# Patient Record
Sex: Male | Born: 2005 | Race: White | Hispanic: No | Marital: Single
Health system: Southern US, Community
[De-identification: ages and names within clinical notes are randomized; demographics above are authoritative.]

## PROBLEM LIST (undated history)

## (undated) DIAGNOSIS — R569 Unspecified convulsions: Secondary | ICD-10-CM

## (undated) HISTORY — DX: Unspecified convulsions: R56.9

---
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
## 2023-03-25 DIAGNOSIS — G40109 Localization-related (focal) (partial) symptomatic epilepsy and epileptic syndromes with simple partial seizures, not intractable, without status epilepticus: Secondary | ICD-10-CM | POA: Diagnosis not present

## 2023-06-24 DIAGNOSIS — G40109 Localization-related (focal) (partial) symptomatic epilepsy and epileptic syndromes with simple partial seizures, not intractable, without status epilepticus: Secondary | ICD-10-CM | POA: Diagnosis not present

## 2023-07-02 DIAGNOSIS — R569 Unspecified convulsions: Secondary | ICD-10-CM | POA: Diagnosis not present

## 2023-07-18 DIAGNOSIS — G40109 Localization-related (focal) (partial) symptomatic epilepsy and epileptic syndromes with simple partial seizures, not intractable, without status epilepticus: Secondary | ICD-10-CM | POA: Diagnosis not present

## 2023-08-09 ENCOUNTER — Encounter (HOSPITAL_COMMUNITY): Payer: Self-pay

## 2023-08-09 ENCOUNTER — Inpatient Hospital Stay (HOSPITAL_COMMUNITY)
Admission: EM | Admit: 2023-08-09 | Discharge: 2023-08-11 | DRG: 101 | Disposition: A | Discharge status: Home / Self Care | Attending: Family Medicine | Admitting: Family Medicine

## 2023-08-09 ENCOUNTER — Emergency Department (HOSPITAL_COMMUNITY)

## 2023-08-09 ENCOUNTER — Other Ambulatory Visit: Payer: Self-pay

## 2023-08-09 ENCOUNTER — Inpatient Hospital Stay (HOSPITAL_COMMUNITY)

## 2023-08-09 DIAGNOSIS — W06XXXA Fall from bed, initial encounter: Secondary | ICD-10-CM | POA: Diagnosis present

## 2023-08-09 DIAGNOSIS — Z781 Physical restraint status: Secondary | ICD-10-CM

## 2023-08-09 DIAGNOSIS — Z7722 Contact with and (suspected) exposure to environmental tobacco smoke (acute) (chronic): Secondary | ICD-10-CM | POA: Diagnosis present

## 2023-08-09 DIAGNOSIS — S0083XA Contusion of other part of head, initial encounter: Secondary | ICD-10-CM | POA: Diagnosis present

## 2023-08-09 DIAGNOSIS — R451 Restlessness and agitation: Secondary | ICD-10-CM

## 2023-08-09 DIAGNOSIS — S199XXA Unspecified injury of neck, initial encounter: Secondary | ICD-10-CM | POA: Diagnosis not present

## 2023-08-09 DIAGNOSIS — G40409 Other generalized epilepsy and epileptic syndromes, not intractable, without status epilepticus: Principal | ICD-10-CM | POA: Diagnosis present

## 2023-08-09 DIAGNOSIS — R0689 Other abnormalities of breathing: Secondary | ICD-10-CM | POA: Diagnosis not present

## 2023-08-09 DIAGNOSIS — R569 Unspecified convulsions: Secondary | ICD-10-CM | POA: Diagnosis not present

## 2023-08-09 DIAGNOSIS — R400 Somnolence: Secondary | ICD-10-CM | POA: Diagnosis not present

## 2023-08-09 DIAGNOSIS — G4700 Insomnia, unspecified: Secondary | ICD-10-CM | POA: Diagnosis not present

## 2023-08-09 DIAGNOSIS — Z79899 Other long term (current) drug therapy: Secondary | ICD-10-CM | POA: Diagnosis not present

## 2023-08-09 DIAGNOSIS — E876 Hypokalemia: Secondary | ICD-10-CM | POA: Diagnosis present

## 2023-08-09 DIAGNOSIS — Y92003 Bedroom of unspecified non-institutional (private) residence as the place of occurrence of the external cause: Secondary | ICD-10-CM | POA: Diagnosis not present

## 2023-08-09 DIAGNOSIS — H5500 Unspecified nystagmus: Secondary | ICD-10-CM | POA: Diagnosis not present

## 2023-08-09 DIAGNOSIS — R6889 Other general symptoms and signs: Secondary | ICD-10-CM | POA: Diagnosis not present

## 2023-08-09 DIAGNOSIS — R9431 Abnormal electrocardiogram [ECG] [EKG]: Secondary | ICD-10-CM | POA: Diagnosis not present

## 2023-08-09 DIAGNOSIS — R000 Tachycardia, unspecified: Secondary | ICD-10-CM | POA: Diagnosis not present

## 2023-08-09 DIAGNOSIS — S0990XA Unspecified injury of head, initial encounter: Secondary | ICD-10-CM | POA: Diagnosis not present

## 2023-08-09 DIAGNOSIS — R32 Unspecified urinary incontinence: Secondary | ICD-10-CM | POA: Diagnosis not present

## 2023-08-09 DIAGNOSIS — Z743 Need for continuous supervision: Secondary | ICD-10-CM | POA: Diagnosis not present

## 2023-08-09 LAB — CBC WITH DIFFERENTIAL/PLATELET
Abs Immature Granulocytes: 0.04 10*3/uL (ref 0.00–0.07)
Basophils Absolute: 0.1 10*3/uL (ref 0.0–0.1)
Basophils Relative: 0 %
Eosinophils Absolute: 0 10*3/uL (ref 0.0–0.5)
Eosinophils Relative: 0 %
HCT: 40.4 % (ref 39.0–52.0)
Hemoglobin: 13.8 g/dL (ref 13.0–17.0)
Immature Granulocytes: 0 %
Lymphocytes Relative: 9 %
Lymphs Abs: 1.1 10*3/uL (ref 0.7–4.0)
MCH: 30.7 pg (ref 26.0–34.0)
MCHC: 34.2 g/dL (ref 30.0–36.0)
MCV: 90 fL (ref 80.0–100.0)
Monocytes Absolute: 0.7 10*3/uL (ref 0.1–1.0)
Monocytes Relative: 6 %
Neutro Abs: 9.4 10*3/uL — ABNORMAL HIGH (ref 1.7–7.7)
Neutrophils Relative %: 85 %
Platelets: 231 10*3/uL (ref 150–400)
RBC: 4.49 MIL/uL (ref 4.22–5.81)
RDW: 12.5 % (ref 11.5–15.5)
WBC: 11.3 10*3/uL — ABNORMAL HIGH (ref 4.0–10.5)
nRBC: 0 % (ref 0.0–0.2)

## 2023-08-09 LAB — COMPREHENSIVE METABOLIC PANEL WITH GFR
ALT: 21 U/L (ref 0–44)
AST: 22 U/L (ref 15–41)
Albumin: 4.4 g/dL (ref 3.5–5.0)
Alkaline Phosphatase: 61 U/L (ref 38–126)
Anion gap: 10 (ref 5–15)
BUN: 12 mg/dL (ref 6–20)
CO2: 22 mmol/L (ref 22–32)
Calcium: 9.1 mg/dL (ref 8.9–10.3)
Chloride: 101 mmol/L (ref 98–111)
Creatinine, Ser: 0.8 mg/dL (ref 0.61–1.24)
GFR, Estimated: >60 mL/min (ref >60)
Glucose, Bld: 88 mg/dL (ref 70–99)
Potassium: 4 mmol/L (ref 3.5–5.1)
Sodium: 133 mmol/L — ABNORMAL LOW (ref 135–145)
Total Bilirubin: 0.5 mg/dL (ref 0.0–1.2)
Total Protein: 7 g/dL (ref 6.5–8.1)

## 2023-08-09 LAB — CBG MONITORING, ED: Glucose-Capillary: 104 mg/dL — ABNORMAL HIGH (ref 70–99)

## 2023-08-09 LAB — GLUCOSE, CAPILLARY: Glucose-Capillary: 94 mg/dL (ref 70–99)

## 2023-08-09 MED ORDER — LEVETIRACETAM IN NACL 1000 MG/100ML IV SOLN: 1000.0000 mg | Freq: Once | INTRAVENOUS | Status: AC

## 2023-08-09 MED ORDER — LEVETIRACETAM IN NACL 1000 MG/100ML IV SOLN
INTRAVENOUS | Status: AC
  Administered 2023-08-09: 1000 mg via INTRAVENOUS
  Filled 2023-08-09: qty 400

## 2023-08-09 MED ORDER — LORAZEPAM 2 MG/ML IJ SOLN
1.0000 mg | INTRAMUSCULAR | Status: DC | PRN
  Filled 2023-08-09: qty 1

## 2023-08-09 MED ORDER — LORAZEPAM 2 MG/ML IJ SOLN
1.0000 mg | INTRAMUSCULAR | Status: DC
Start: 2023-08-09 — End: 2023-08-09

## 2023-08-09 MED ORDER — SODIUM CHLORIDE 0.9 % IV SOLN: 60.0000 mg/kg | Freq: Once | INTRAVENOUS | Status: DC

## 2023-08-09 MED ORDER — SODIUM CHLORIDE 0.9 % IV SOLN
200.0000 mg | Freq: Once | INTRAVENOUS | Status: AC
  Administered 2023-08-09: 200 mg via INTRAVENOUS
  Filled 2023-08-09: qty 20

## 2023-08-09 MED ORDER — LEVETIRACETAM IN NACL 1500 MG/100ML IV SOLN
1500.0000 mg | Freq: Once | INTRAVENOUS | Status: AC
  Administered 2023-08-09: 1500 mg via INTRAVENOUS

## 2023-08-09 MED ORDER — LORAZEPAM 2 MG/ML IJ SOLN
INTRAMUSCULAR | Status: AC
  Administered 2023-08-09: 2 mg
  Filled 2023-08-09: qty 1

## 2023-08-09 MED ORDER — LORAZEPAM 2 MG/ML IJ SOLN
INTRAMUSCULAR | Status: AC
  Filled 2023-08-09: qty 1

## 2023-08-09 MED ORDER — OXCARBAZEPINE 300 MG/5ML PO SUSP
600.0000 mg | Freq: Every morning | ORAL | Status: DC
  Filled 2023-08-09: qty 10

## 2023-08-09 MED ORDER — HEPARIN SODIUM (PORCINE) 5000 UNIT/ML IJ SOLN
5000.0000 [IU] | Freq: Three times a day (TID) | INTRAMUSCULAR | Status: DC
  Administered 2023-08-10 – 2023-08-11 (×3): 5000 [IU] via SUBCUTANEOUS
  Filled 2023-08-09 (×3): qty 1

## 2023-08-09 MED ORDER — SODIUM CHLORIDE 0.9 % IV SOLN
100.0000 mg | Freq: Two times a day (BID) | INTRAVENOUS | Status: DC
  Administered 2023-08-10: 100 mg via INTRAVENOUS
  Filled 2023-08-09 (×2): qty 10

## 2023-08-09 MED ORDER — LORAZEPAM 2 MG/ML IJ SOLN
1.0000 mg | INTRAMUSCULAR | Status: AC | PRN
  Administered 2023-08-09 (×2): 1 mg via INTRAVENOUS
  Filled 2023-08-09: qty 1

## 2023-08-09 MED ORDER — LACTATED RINGERS IV SOLN: INTRAVENOUS | Status: AC

## 2023-08-09 MED ORDER — OXCARBAZEPINE 300 MG/5ML PO SUSP
900.0000 mg | Freq: Every evening | ORAL | Status: DC
  Filled 2023-08-09: qty 15

## 2023-08-09 NOTE — Progress Notes (Signed)
 Admission Notes:  2100H -  Received patient from [facility] via stretcher accompanied by [facility]. Drowsy but responds to painful stimuli. Oriented to himself, able to tell his name. Able to follow simple commands then back to sleep again. Patient NPO.  [id number]: Seen and examined by Dr. Tollie Eth, MD. Ordered Overnight EEG. Approved to put patient on condom cath for the meantime since patient is on bed rest as ordered. 2230: For EEG placement but patient is agitated and uncooperative. Pulling leads and tried to get out of bed. Informed Dr. Loleta Books, MD. Ordered Ativan IV, given as ordered. Still patient tried to get out of bed. 2234: Ordered wrist and belt restraint for the patient. 2253: Called father Mr. Francella Solian, explained and informed regarding wrist and belt restraint.

## 2023-08-09 NOTE — H&P (Addendum)
 [facility] H&P   Patient Demographics:    Darius Allen, is a 18 y.o. male  MRN: [id number]   DOB - 15-Dec-2005  Admit Date - 08/09/2023  Outpatient Primary MD for the patient is Jeral Pinch, Dorina Hoyer, PA-C  Referring MD/NP/PA: Dr. Estell Harpin  Outpatient Specialists: [facility] neurology pediatric  Patient coming from: Home  Chief Complaint  Patient presents with   Seizures      HPI:    Darius Allen  is a 18 y.o. male, with known past medical history of seizures, is following with [facility] pediatric neurology, he had frequent seizure medications adjustments due to side effect of seizure meds or poor response, most recently he has been on Trileptal, but when he still used to have seizures he went on Trileptal taper with Aptiom initiation, per family they report he is usually as well having insomnia and poor night sleep. -History provided by family at bedside, patient presents to ED secondary to multiple seizures overnight, they report over 12 seizures, one of them he had a fall from the bed and hurt his head with some significant bruising, and ED patient has been had another 2 seizures where he required Ativan and he was loaded with Keppra, he had urine incontinence during ED seizure event as well, CT head with no acute findings, significant for mild hypokalemia at 133, mild leukocytosis at 11.3, ED physician discussed with neurology on-call who recommended loading with Keppra and transferring to [facility] hospital[facility] consulted to admit.    Review of systems:    Patient unable to provide any history given his postictal and he received Ativan as well  With Past History of the following :    Past Medical History:  Diagnosis Date   Seizures (HCC)    no seizure since starting trileptal 12/2019      Past Surgical History:  Procedure Laterality Date   ORIF RADIAL FRACTURE Right 08/16/2020    Procedure: OPEN REDUCTION INTERNAL FIXATION (ORIF) RADIAL AND ULNAR FRACTURE;  Surgeon: Sheral Apley, MD;  Location: [facility];  Service: Orthopedics;  Laterality: Right;   ORIF ULNAR FRACTURE Right 08/16/2020   Procedure: OPEN REDUCTION INTERNAL FIXATION (ORIF) ULNAR FRACTURE;  Surgeon: Sheral Apley, MD;  Location: [facility];  Service: Orthopedics;  Laterality: Right;      Social History:     Social History   Tobacco Use   Smoking status: Passive Smoke Exposure - Never Smoker   Smokeless tobacco: Never   Tobacco comments:    outside smoking  Substance Use Topics   Alcohol use: Not Currently    Alcohol/week: 0.0 standard drinks of alcohol       Family History :    History reviewed. No pertinent family history.    Home Medications:   Prior to Admission medications   Medication Sig Start Date End Date Taking? Authorizing Provider  Eslicarbazepine Acetate (APTIOM) 800 MG TABS Take 2 tablets by mouth at bedtime.   Yes [provider]  Midazolam 5 MG/0.1ML SOLN Place 5 mg into the nose as needed (seizures lasting >5 minutes long OR 3+ seizures in 1 hour without return to baseline). 06/06/23  Yes [provider]  oxcarbazepine (TRILEPTAL) 600 MG tablet Take 600 mg by mouth See admin instructions. Take 1 tablet in the morning and 1 and 1/2 in the evening 07/12/20  Yes [provider]     Allergies:    No Known Allergies   Physical Exam:   Vitals  Blood pressure 117/63, pulse 76, temperature 98 F (36.7 C), temperature source Axillary, resp. rate (!) 23, height 6' (1.829 m), weight 68 kg, SpO2 97%.   1. General Somnolent as he just received Ativan,  2.  Somnolent, wakes up but unable to answer any question or follow commands go back to sleep right away  3.  No gross deficits noted patient with some forehead bruises from fall secondary to seizures  4. Ears and Eyes appear Normal, Conjunctivae clear,  postdilated after receiving Ativan.  Moist Oral Mucosa.  5. Supple Neck, No JVD, No cervical lymphadenopathy appriciated, No Carotid Bruits.  6. Symmetrical Chest wall movement, Good air movement bilaterally, CTAB.  7. RRR, No Gallops, Rubs or Murmurs, No Parasternal Heave.  8. Positive Bowel Sounds, Abdomen Soft, No tenderness, No organomegaly appriciated,No rebound -guarding or rigidity.  9.  No Cyanosis, Normal Skin Turgor, No Skin Rash or Bruise.  10. Good muscle tone,  joints appear normal , no effusions       Data Review:    CBC Recent Labs  Lab 08/09/23 1550  WBC 11.3*  HGB 13.8  HCT 40.4  PLT 231  MCV 90.0  MCH 30.7  MCHC 34.2  RDW 12.5  LYMPHSABS 1.1  MONOABS 0.7  EOSABS 0.0  BASOSABS 0.1   ------------------------------------------------------------------------------------------------------------------  Chemistries  Recent Labs  Lab 08/09/23 1550  NA 133*  K 4.0  CL 101  CO2 22  GLUCOSE 88  BUN 12  CREATININE 0.80  CALCIUM 9.1  AST 22  ALT 21  ALKPHOS 61  BILITOT 0.5   ------------------------------------------------------------------------------------------------------------------ estimated creatinine clearance is 142.8 mL/min (by C-G formula based on SCr of 0.8 mg/dL). ------------------------------------------------------------------------------------------------------------------ No results for input(s): "TSH", "T4TOTAL", "T3FREE", "THYROIDAB" in the last 72 hours.  Invalid input(s): "FREET3"  Coagulation profile No results for input(s): "INR", "PROTIME" in the last 168 hours. ------------------------------------------------------------------------------------------------------------------- No results for input(s): "DDIMER" in the last 72 hours. -------------------------------------------------------------------------------------------------------------------  Cardiac Enzymes No results for input(s): "CKMB", "TROPONINI", "MYOGLOBIN"  in the last 168 hours.  Invalid input(s): "CK" ------------------------------------------------------------------------------------------------------------------ No results found for: "BNP"   ---------------------------------------------------------------------------------------------------------------  Urinalysis No results found for: "COLORURINE", "APPEARANCEUR", "LABSPEC", "PHURINE", "GLUCOSEU", "HGBUR", "BILIRUBINUR", "KETONESUR", "PROTEINUR", "UROBILINOGEN", "NITRITE", "LEUKOCYTESUR"  ----------------------------------------------------------------------------------------------------------------   Imaging Results:    CT Cervical Spine Wo Contrast Result Date: 08/09/2023 CLINICAL DATA:  Neck trauma, focal neuro deficit or paresthesia (Age 49-64y) EXAM: CT CERVICAL SPINE WITHOUT CONTRAST TECHNIQUE: Multidetector CT imaging of the cervical spine was performed without intravenous contrast. Multiplanar CT image reconstructions were also generated. RADIATION DOSE REDUCTION: This exam was performed according to the departmental dose-optimization program which includes automated exposure control, adjustment of the mA and/or kV according to patient size and/or use of iterative reconstruction technique. COMPARISON:  None Available. FINDINGS: Alignment: Straightening and mild broad-based reversal of normal lordosis. No traumatic subluxation. Skull base and vertebrae: No acute fracture. Vertebral body heights  are maintained. The dens and skull base are intact. None fusion posterior arch of C1 is a normal variant. Soft tissues and spinal canal: No prevertebral fluid or swelling. No visible canal hematoma. Disc levels:  Preserved. Upper chest: Negative. Other: None. IMPRESSION: 1. No acute fracture or subluxation of the cervical spine. 2. Straightening and mild broad-based reversal of normal lordosis may be positional or due to muscle spasm. Electronically Signed   By: Narda Rutherford M.D.   On: 08/09/2023  17:34   CT Head Wo Contrast Result Date: 08/09/2023 CLINICAL DATA:  Head trauma, abnormal mental status (Age 85-64y) EXAM: CT HEAD WITHOUT CONTRAST TECHNIQUE: Contiguous axial images were obtained from the base of the skull through the vertex without intravenous contrast. RADIATION DOSE REDUCTION: This exam was performed according to the departmental dose-optimization program which includes automated exposure control, adjustment of the mA and/or kV according to patient size and/or use of iterative reconstruction technique. COMPARISON:  None Available. FINDINGS: Brain: No intracranial hemorrhage, mass effect, or midline shift. No hydrocephalus. The basilar cisterns are patent. No evidence of territorial infarct or acute ischemia. No extra-axial or intracranial fluid collection. Vascular: No hyperdense vessel or unexpected calcification. Skull: Normal. Negative for fracture or focal lesion. Sinuses/Orbits: Scattered mucosal thickening in the ethmoid air cells. No sinus fluid levels. Unremarkable appearance of the orbits. Other: None. IMPRESSION: No acute intracranial abnormality. No skull fracture. Electronically Signed   By: Narda Rutherford M.D.   On: 08/09/2023 17:31     EKG:  Vent. rate 88 BPM PR interval 170 ms QRS duration 92 ms QT/QTcB 346/419 ms P-R-T axes 75 77 72 Sinus rhythm ST elev, probable normal early repol pattern  Assessment & Plan:    Principal Problem:   Seizure (HCC)    Seizures -With known extensive history of seizures, followed at [facility] pediatric neurology clinic, with frequent medication adjustments, most recently he had been on  Trileptal 2-week wean and initiation of Aptium, unclear if this weaning is too rapid for him detriment of his seizures, but there family at bedside report this is states has ever been, they report patient is compliant with medications. -Patient was loaded with Keppra per initial ED discussion with neurorecommendation, he is transferred to [facility] for further evaluation and further recommendation per neurology regarding AEDs and possible LTM EEG. -Continue with as needed Ativan for seizures -Continue with seizure precautions   DVT Prophylaxis Heparin  AM Labs Ordered, also please review Full Orders  Family Communication: Admission, patients condition and plan of care including tests being ordered have been discussed with the patient's brother and father at bedside who indicate understanding and agree with the plan and Code Status.  Code Status full code  Likely DC to home  Consults called: Neurology  Admission status: Inpatient  Time spent in minutes : 55 minutes   Huey Bienenstock M.D on 08/09/2023 at 8:23 PM   [facility] - Office  [phone]

## 2023-08-09 NOTE — ED Triage Notes (Signed)
 Pt BIB ems for multiples seizure episodes. Pt was just started on Aptiom. Per EMS pt family stated postictal, EMS states pt jumped at them.

## 2023-08-09 NOTE — Progress Notes (Signed)
 LTM EEG hooked up and running - no initial skin breakdown - push button tested - [facility] monitoring.

## 2023-08-09 NOTE — ED Notes (Signed)
 Report called

## 2023-08-09 NOTE — ED Notes (Signed)
 Called to room for patient actively seizing.  Dr. Estell Harpin at bedside.

## 2023-08-09 NOTE — Consult Note (Signed)
 NEUROLOGY CONSULT NOTE   Date of service: August 09, 2023 Patient Name: Darius Allen MRN:  [id number] DOB:  February 20, 2006 Chief Complaint: "clustering of seizures" Requesting Provider: Starleen Arms, MD  History of Present Illness  Darius Allen is a 18 y.o. male with hx of epilepsy and follows with [facility] pediatric neurology and recently switched from Trileptal to Aptiom due to multiple breakthrough seizures, who was brought in to the ED at [facility] after having 12 seizures in a day.  Father reports that patient has been sleeping much less over the last couple of days. He just finished a cross taper of trileptal and weaened off Trileptal over the course of 3 weeks and uptitrated on Aptiom over the same period. Over the last 2 days, he has only taken Aptiom. He is on 1600mg  at bedtime. He had multiple seizures yesterday and had about 12 seizures today so they decided to call EMS.  No fever, no recent URI like symptoms.  Patient had a CT HEAd at [facility] which was negative for any acute intracranial abnormalities.  He was transferred to [facility] for further evaluation and workup.    ROS  Unable to ascertain due to somnolence.  Past History   Past Medical History:  Diagnosis Date   Seizures (HCC)    no seizure since starting trileptal 12/2019    Past Surgical History:  Procedure Laterality Date   ORIF RADIAL FRACTURE Right 08/16/2020   Procedure: OPEN REDUCTION INTERNAL FIXATION (ORIF) RADIAL AND ULNAR FRACTURE;  Surgeon: Sheral Apley, MD;  Location: [facility];  Service: Orthopedics;  Laterality: Right;   ORIF ULNAR FRACTURE Right 08/16/2020   Procedure: OPEN REDUCTION INTERNAL FIXATION (ORIF) ULNAR FRACTURE;  Surgeon: Sheral Apley, MD;  Location: [facility];  Service: Orthopedics;  Laterality: Right;    Family History: History reviewed. No pertinent family history.  Social History  reports that he is a non-smoker but has  been exposed to tobacco smoke. He has never used smokeless tobacco. He reports that he does not currently use alcohol. He reports that he does not currently use drugs.  No Known Allergies  Medications   Current Facility-Administered Medications:    [START ON 08/10/2023] heparin injection 5,000 Units, 5,000 Units, Subcutaneous, Q8H, Elgergawy, Leana Roe, MD   [START ON 08/10/2023] lacosamide (VIMPAT) 100 mg in sodium chloride 0.9 % 25 mL IVPB, 100 mg, Intravenous, BID, Erick Blinks, MD   lacosamide (VIMPAT) 200 mg in sodium chloride 0.9 % 25 mL IVPB, 200 mg, Intravenous, Once, Erick Blinks, MD   lactated ringers infusion, , Intravenous, Continuous, Elgergawy, Leana Roe, MD, Last Rate: 75 mL/hr at 08/09/23 2125, New Bag at 08/09/23 2125   LORazepam (ATIVAN) injection 1 mg, 1 mg, Intravenous, Q4H PRN, Elgergawy, Leana Roe, MD  Vitals   Vitals:   08/09/23 1745 08/09/23 1930 08/09/23 1943 08/09/23 2109  BP:  117/63  (!) 110/58  Pulse: 76   77  Resp: 20 (!) 23  16  Temp:   98 F (36.7 C) 98.7 F (37.1 C)  TempSrc:   Axillary Oral  SpO2: 97%   97%  Weight:      Height:        Body mass index is 20.33 kg/m.  Physical Exam   General: Laying comfortably in bed; in no acute distress.  HENT: Normal oropharynx and mucosa. Normal external appearance of ears and nose.  Neck: Supple, no pain or tenderness  CV: No  JVD. No peripheral edema.  Pulmonary: Symmetric Chest rise. Normal respiratory effort.  Abdomen: Soft to touch, non-tender.  Ext: No cyanosis, edema, or deformity  Skin: No rash. Normal palpation of skin.   Musculoskeletal: Normal digits and nails by inspection. No clubbing.   Neurologic Examination  Mental status/Cognition: eyes closed, does not respond to voice or loud clap. Agitated to tactile stimulation and opens eyes to vigorous tactile stimulation and oriented to self. Speech/language: oriented to self, follows commands in all 4 extremities Cranial nerves:   CN  II Pupils equal and reactive to light, makes brief eye contact   CN III,IV,VI EOM intact, no gaze preference or deviation, no nystagmus   CN V normal sensation in V1, V2, and V3 segments bilaterally    CN VII no asymmetry, no nasolabial fold flattening    CN VIII normal hearing to speech    CN IX & X normal palatal elevation, no uvular deviation    CN XI 5/5 head turn and 5/5 shoulder shrug bilaterally    CN XII midline tongue protrusion    Motor:  Muscle bulk: normal, tone normal Squeezes with BL hands on commands. Wiggles toes on command.  Sensation:  Light touch Intact in all extremities.   Pin prick    Temperature    Vibration   Proprioception    Coordination/Complex Motor:  Unabel to assess 2/2 somnolence.  Labs/Imaging/Neurodiagnostic studies   CBC:  Recent Labs  Lab 2023-08-31 1550  WBC 11.3*  NEUTROABS 9.4*  HGB 13.8  HCT 40.4  MCV 90.0  PLT 231   Basic Metabolic Panel:  Lab Results  Component Value Date   NA 133 (L) 08/31/23   K 4.0 08-31-23   CO2 22 08/31/23   GLUCOSE 88 2023/08/31   BUN 12 08-31-23   CREATININE 0.80 08/31/23   CALCIUM 9.1 08-31-2023   GFRNONAA >60 08-31-2023   Lipid Panel: No results found for: "LDLCALC" HgbA1c: No results found for: "HGBA1C" Urine Drug Screen: No results found for: "LABOPIA", "COCAINSCRNUR", "LABBENZ", "AMPHETMU", "THCU", "LABBARB"  Alcohol Level No results found for: "ETH" INR No results found for: "INR" APTT No results found for: "APTT" AED levels: No results found for: "PHENYTOIN", "ZONISAMIDE", "LAMOTRIGINE", "LEVETIRACETA"  CT Head without contrast(Personally reviewed): CTH was negative for a large hypodensity concerning for a large territory infarct or hyperdensity concerning for an ICH  Neurodiagnostics cEEG:  pending  ASSESSMENT   Darius Allen is a 18 y.o. male with hx of epilepsy and follows with [facility] pediatric neurology and recently switched from Trileptal to Aptiom due to multiple  breakthrough seizures, who was brought in to the ED at [facility] after having 12 seizures in a day.  Has been sleep deprived recently. Family worried about Aptiom not being effective. No fever, no recent URI like symptoms.  He is too somnolent to tolerate PO and too agitated to attempt NG tube overnight.  Family requesting switching back to Trileptal.  RECOMMENDATIONS  - Vimpat 200mg  IV once, followed by 100mg  BID overnight. Failed Keppra in the past. - consider starting back on trileptal in AM when he is more awake. Will likely need gradual uptitration. Starting at 300mg  BID and gradually uptitrating to 600mg  in AM and 900mg  in PM which was his previous dose. Can increase dose by 600mg  every week. Will likely need Vimpat as a bridge while uptitrating Trileptal. - cEEG overnight - Ativan for seizure lasting more than 3 mins - seizure precautions. - will stop Aptiom at family's request.  ______________________________________________________________________  Plan discussed with patient's father over phone. Also discussed needs for soft wrist restraints due to agitation.   Signed, Erick Blinks, MD [facility]

## 2023-08-10 ENCOUNTER — Inpatient Hospital Stay (HOSPITAL_COMMUNITY)

## 2023-08-10 DIAGNOSIS — R569 Unspecified convulsions: Secondary | ICD-10-CM | POA: Diagnosis not present

## 2023-08-10 LAB — BASIC METABOLIC PANEL WITH GFR
Anion gap: 9 (ref 5–15)
BUN: 7 mg/dL (ref 6–20)
CO2: 24 mmol/L (ref 22–32)
Calcium: 9.2 mg/dL (ref 8.9–10.3)
Chloride: 104 mmol/L (ref 98–111)
Creatinine, Ser: 0.77 mg/dL (ref 0.61–1.24)
GFR, Estimated: >60 mL/min (ref >60)
Glucose, Bld: 79 mg/dL (ref 70–99)
Potassium: 4 mmol/L (ref 3.5–5.1)
Sodium: 137 mmol/L (ref 135–145)

## 2023-08-10 LAB — CBC
HCT: 37.6 % — ABNORMAL LOW (ref 39.0–52.0)
Hemoglobin: 12.7 g/dL — ABNORMAL LOW (ref 13.0–17.0)
MCH: 30.4 pg (ref 26.0–34.0)
MCHC: 33.8 g/dL (ref 30.0–36.0)
MCV: 90 fL (ref 80.0–100.0)
Platelets: 200 10*3/uL (ref 150–400)
RBC: 4.18 MIL/uL — ABNORMAL LOW (ref 4.22–5.81)
RDW: 12.8 % (ref 11.5–15.5)
WBC: 10.5 10*3/uL (ref 4.0–10.5)
nRBC: 0 % (ref 0.0–0.2)

## 2023-08-10 LAB — GLUCOSE, CAPILLARY
Glucose-Capillary: 168 mg/dL — ABNORMAL HIGH (ref 70–99)
Glucose-Capillary: 194 mg/dL — ABNORMAL HIGH (ref 70–99)
Glucose-Capillary: 53 mg/dL — ABNORMAL LOW (ref 70–99)
Glucose-Capillary: 54 mg/dL — ABNORMAL LOW (ref 70–99)
Glucose-Capillary: 59 mg/dL — ABNORMAL LOW (ref 70–99)
Glucose-Capillary: 99 mg/dL (ref 70–99)

## 2023-08-10 LAB — RAPID URINE DRUG SCREEN, HOSP PERFORMED
Amphetamines: NOT DETECTED
Barbiturates: NOT DETECTED
Benzodiazepines: POSITIVE — AB
Cocaine: NOT DETECTED
Opiates: NOT DETECTED
Tetrahydrocannabinol: POSITIVE — AB

## 2023-08-10 LAB — MAGNESIUM: Magnesium: 2 mg/dL (ref 1.7–2.4)

## 2023-08-10 LAB — PHOSPHORUS: Phosphorus: 3.5 mg/dL (ref 2.5–4.6)

## 2023-08-10 LAB — CK: Total CK: 232 U/L (ref 49–397)

## 2023-08-10 MED ORDER — OXCARBAZEPINE 300 MG PO TABS
300.0000 mg | ORAL_TABLET | Freq: Two times a day (BID) | ORAL | Status: DC
Start: 2023-08-10 — End: 2023-08-11
  Administered 2023-08-10 – 2023-08-11 (×2): 300 mg via ORAL
  Filled 2023-08-10 (×2): qty 1

## 2023-08-10 MED ORDER — DEXTROSE 50 % IV SOLN
50.0000 mL | Freq: Once | INTRAVENOUS | Status: AC
  Administered 2023-08-10: 50 mL via INTRAVENOUS

## 2023-08-10 MED ORDER — DEXTROSE 50 % IV SOLN
50.0000 mL | Freq: Once | INTRAVENOUS | Status: AC
  Filled 2023-08-10: qty 50

## 2023-08-10 MED ORDER — SODIUM CHLORIDE 0.9 % IV SOLN
100.0000 mg | Freq: Two times a day (BID) | INTRAVENOUS | Status: DC
  Filled 2023-08-10 (×2): qty 10

## 2023-08-10 MED ORDER — LACOSAMIDE 50 MG PO TABS
100.0000 mg | ORAL_TABLET | Freq: Two times a day (BID) | ORAL | Status: DC
  Administered 2023-08-10 – 2023-08-11 (×2): 100 mg via ORAL
  Filled 2023-08-10 (×3): qty 2

## 2023-08-10 MED ORDER — DEXTROSE 50 % IV SOLN
INTRAVENOUS | Status: AC
  Filled 2023-08-10: qty 50

## 2023-08-10 NOTE — Plan of Care (Addendum)
 Off restraint. Patient still drowsy but follows commands, conts on LR, EEG   Hypoglycemia x2 and treated. Patient pulled off his PIV, education provided, patient is awake and verbalized understanding to the need of an iv access.   1840 Patient pulled out his PIV saying he wants to go. Education regarding importance of iv access explained. Patient is very teary. Provider made aware, with question on switching IV to PO meds. Charge made aware.  Problem: Health Behavior/Discharge Planning: Goal: Ability to manage health-related needs will improve Outcome: Not Met (add Reason)   Problem: Activity: Goal: Risk for activity intolerance will decrease Outcome: Not Met (add Reason)   Problem: Pain Managment: Goal: General experience of comfort will improve and/or be controlled Outcome: Not Met (add Reason)     Problem: Skin Integrity: Goal: Risk for impaired skin integrity will decrease Outcome: Not Met (add Reason)   Problem: Safety: Goal: Non-violent Restraint(s) Outcome: Not Met (add Reason)

## 2023-08-10 NOTE — Progress Notes (Signed)
 Hypoglycemic Event  CBG: 59  Treatment: D50 50 mL (25 gm)  Symptoms: None  Follow-up CBG: Time:18 40 pm CBG Result:168  Possible Reasons for Event: Inadequate meal intake  Comments/MD notified: Hughie Closs, MD    Ileana Roup

## 2023-08-10 NOTE — Progress Notes (Signed)
 Neurology Progress Note  Brief HPI: 18 year old male with history of epilepsy followed by [facility] pediatric neurology recently switched from Trileptal to Apitom due to multiple breakthrough seizures, transferred to [facility] where he was evaluated for 12 seizures in one day at home.  Patient with reported impaired sleep over the last week.  Recent cross taper of Trileptal with wean over 3 weeks and up titration of Apitom over that time.  Patient with Apitom monotherapy for 2 days PTA (1600 mg at bedtime) with multiple seizures reported on 3/27 and 12 seizures on 3/28.   Subjective: Patient laying in bed sleeping with soft bilateral wrist restraints and lap belt.  Patient does not participate with exam this morning.  Question whether patient is lethargic versus lack of patient effort.  No family at bedside this morning.  Staff reports some combativeness, agitation, and uncooperative behaviors overnight requiring restraints.   Exam: Vitals:   08/09/23 2325 08/10/23 0457  BP: (!) 115/58 (!) 113/49  Pulse: 81 (!) 56  Resp: 16 18  Temp: 98.1 F (36.7 C) 98.5 F (36.9 C)  SpO2: 99% 99%   Gen: Laying in bed sleeping, in no acute distress Resp: non-labored breathing, no respiratory distress on room air  Abd: soft, nt  Neuro: Mental Status: Asleep, does not arouse to tactile or verbal stimuli.  Briefly opens eyes and moves each extremity with light application of noxious stimuli before curling back up to go to sleep.  He does not follow commands and does not verbalize to examiner.  He does a light groan once with ongoing stimuli to express dissatisfaction with ongoing exam attempts.  Does not participate with exam 2/2 post-ictal state versus possible intentional component.  Cranial Nerves: PERRL (limited assessment due to patient resistance), briefly opens eyes and fixates on examiner in midline before closing them but does not track, resists further passive eye opening, does not phonate or allow for  visualization of soft palate, does not shrug shoulders or protrude tongue to command Motor/Sensory: Moves spontaneously throughout, withdrawals to noxious stimuli throughout.  Does not follow commands.  Will not participate with confrontational strength testing.  Gait: Deferred for patient safety.  On later MD exam at approximately 2:30 PM Generally no acute distress, laying in bed asleep but awakens easily to voice and light touch  Asks to speak to his father on the phone.  Reports his age and that he had a seizure.  Able to name the year but not month.  Able to read the date from the white board.  Moving all 4 extremities equally at least antigravity.  Able to have a brief conversation with his father on the phone asking for his phone which patient thought was left at home -- father notes that he already brought the patient his phone as well as a set of clothes and I was able to provide the patient with his phone which he was able to use appropriately  Some nystagmus on initial awakening which resolved.  EOMI, pupils equal round reactive to light, face symmetric, tongue midline  Pertinent Labs: CBC    Component Value Date/Time   WBC 10.5 08/10/2023 0612   RBC 4.18 (L) 08/10/2023 0612   HGB 12.7 (L) 08/10/2023 0612   HCT 37.6 (L) 08/10/2023 0612   PLT 200 08/10/2023 0612   MCV 90.0 08/10/2023 0612   MCH 30.4 08/10/2023 0612   MCHC 33.8 08/10/2023 0612   RDW 12.8 08/10/2023 0612   RDW 13.9 06/30/2014 0000   LYMPHSABS 1.1  08/09/2023 1550   LYMPHSABS 4.0 (H) 06/30/2014 0000   MONOABS 0.7 08/09/2023 1550   EOSABS 0.0 08/09/2023 1550   EOSABS 0.1 06/30/2014 0000   BASOSABS 0.1 08/09/2023 1550   BASOSABS 0.0 06/30/2014 0000   CMP     Component Value Date/Time   NA 137 08/10/2023 0612   K 4.0 08/10/2023 0612   CL 104 08/10/2023 0612   CO2 24 08/10/2023 0612   GLUCOSE 79 08/10/2023 0612   BUN 7 08/10/2023 0612   CREATININE 0.77 08/10/2023 0612   CALCIUM 9.2 08/10/2023 0612   PROT  7.0 08/09/2023 1550   ALBUMIN 4.4 08/09/2023 1550   AST 22 08/09/2023 1550   ALT 21 08/09/2023 1550   ALKPHOS 61 08/09/2023 1550   BILITOT 0.5 08/09/2023 1550   GFRNONAA >60 08/10/2023 0612   Drugs of Abuse     Component Value Date/Time   LABOPIA NONE DETECTED 08/10/2023 1135   COCAINSCRNUR NONE DETECTED 08/10/2023 1135   LABBENZ POSITIVE (A) 08/10/2023 1135   AMPHETMU NONE DETECTED 08/10/2023 1135   THCU POSITIVE (A) 08/10/2023 1135   LABBARB NONE DETECTED 08/10/2023 1135    Imaging Reviewed:  CT Head without contrast(Personally reviewed): CTH was negative for a large hypodensity concerning for a large territory infarct or hyperdensity concerning for an ICH   Neurodiagnostics cEEG:  "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 No seizures were seen throughout the recording."  Assessment: Darius Allen is a 18 y.o. male with hx of epilepsy and follows with [facility] pediatric neurology and recently switched from Trileptal to Aptiom due to multiple breakthrough seizures, who was brought in to the ED at [facility] after having 12 seizures in a day.   Has been sleep deprived recently. Family worried about Aptiom not being effective. No fever, no recent URI like symptoms.   He is too somnolent to tolerate PO and too agitated to attempt NG tube overnight.   Family requesting switching back to Trileptal.  Exam improving throughout the day as documented above (compare NP to MD exam)  Recommendations: - Continue Vimpat 100 mg BID until patient level of alertness improves  - Restart trileptal -- 300mg  BID ordered for now; goal to gradual uptitrate to 600mg  in AM and 900mg  in PM which was his previous dose. Will continue Vimpat as a bridge while uptitrating Trileptal; defer details of cross taper to outpatient neurologist - EEG continue for now as still sleepy and to confirm seizure control on current regimen - UDS +THC, counsel regarding potential  interactions as mental status improves - Ativan for seizure lasting more than 3 mins - Seizure precautions. - Stopped Aptiom at family's request.  Lanae Boast, AGACNP-BC [facility] [phone]  Attending Neurologist's note:  I personally saw this patient, gathering history, performing a full neurologic examination, reviewing relevant labs, personally reviewing relevant imaging including head CT, and formulated the assessment and plan, adding the note above for completeness and clarity to accurately reflect my thoughts   Brooke Dare MD-PhD [facility] [phone] Available 7 AM to 7 PM, outside these hours please contact Neurologist on call listed on AMION

## 2023-08-10 NOTE — Progress Notes (Signed)
 PROGRESS NOTE    OLAN KUREK  ZOX:[id number] DOB: 2005-08-17 DOA: 08/09/2023 PCP: Serita Grit, PA-C   Brief Narrative:  Olney Monier  is a 18 y.o. male, with known past medical history of seizures, is following with [facility] pediatric neurology, he had frequent seizure medications adjustments due to side effect of seizure meds or poor response, most recently he has been on Trileptal, but when he still used to have seizures he went on Trileptal taper with Aptiom initiation, per family they report he is usually as well having insomnia and poor night sleep. -History provided by family at bedside, patient presents to ED secondary to multiple seizures overnight, they report over 12 seizures, one of them he had a fall from the bed and hurt his head with some significant bruising, and ED patient has been had another 2 seizures where he required Ativan and he was loaded with Keppra, he had urine incontinence during ED seizure event as well, CT head with no acute findings, significant for mild hypokalemia at 133, mild leukocytosis at 11.3, ED physician discussed with neurology on-call who recommended loading with Keppra and transferring to [facility], [facility] hospitalist consulted to admit.  Assessment & Plan:   Principal Problem:   Seizure [facility])  Epilepsy/seizure disorder:  followed at [facility] pediatric neurology clinic, with frequent medication adjustments. He just finished a cross taper of trileptal and weaened off Trileptal over the course of 3 weeks and uptitrated on Aptiom over the same period. Over the last 2 days, he has only taken Aptiom. He is on 1600mg  at bedtime. In the ED, patient was loaded with Keppra and per neurology recommendation, started on Vimpat.  Neurology managing and plan to adjust medications accordingly.  As needed Ativan for seizures.  Seizure precautions.  Appreciate neurology help.  DVT prophylaxis: heparin injection 5,000 Units Start: 08/10/23 1400   Code Status:  Full Code  Family Communication:  None present at bedside.    Status is: Inpatient Remains inpatient appropriate because: Patient is still very somnolent   Estimated body mass index is 20.33 kg/m as calculated from the following:   Height as of this encounter: 6' (1.829 m).   Weight as of this encounter: 68 kg.    Nutritional Assessment: Body mass index is 20.33 kg/m.Marland Kitchen Seen by dietician.  I agree with the assessment and plan as outlined below: Nutrition Status:        . Skin Assessment: I have examined the patient's skin and I agree with the wound assessment as performed by the wound care RN as outlined below:    Consultants:  Neurology  Procedures:  None  Antimicrobials:  Anti-infectives (From admission, onward)    None         Subjective: Patient seen and examined.  Very somnolent and would not wake up for me to have any conversation.  However, per nurse, he was able to talk to her just few minutes ago.  It appears that patient just does not want to wake up.  Objective: Vitals:   08/09/23 1943 08/09/23 2109 08/09/23 2325 08/10/23 0457  BP:  (!) 110/58 (!) 115/58 (!) 113/49  Pulse:  77 81 (!) 56  Resp:  16 16 18   Temp: 98 F (36.7 C) 98.7 F (37.1 C) 98.1 F (36.7 C) 98.5 F (36.9 C)  TempSrc: Axillary Oral Oral Oral  SpO2:  97% 99% 99%  Weight:      Height:        Intake/Output Summary (Last  24 hours) at 08/10/2023 0755 Last data filed at 08/10/2023 0500 Gross per 24 hour  Intake 450 ml  Output 900 ml  Net -450 ml   Filed Weights   08/09/23 1540  Weight: 68 kg    Examination:  General exam: Sleeping/somnolent? Respiratory system: Clear to auscultation. Respiratory effort normal. Cardiovascular system: S1 & S2 heard, RRR. No JVD, murmurs, rubs, gallops or clicks. No pedal edema. Gastrointestinal system: Abdomen is nondistended, soft and nontender. No organomegaly or masses felt. Normal bowel sounds heard.   Data Reviewed: I have  personally reviewed following labs and imaging studies  CBC: Recent Labs  Lab 08/09/23 1550 08/10/23 0612  WBC 11.3* 10.5  NEUTROABS 9.4*  --   HGB 13.8 12.7*  HCT 40.4 37.6*  MCV 90.0 90.0  PLT 231 200   Basic Metabolic Panel: Recent Labs  Lab 08/09/23 1550 08/10/23 0612  NA 133* 137  K 4.0 4.0  CL 101 104  CO2 22 24  GLUCOSE 88 79  BUN 12 7  CREATININE 0.80 0.77  CALCIUM 9.1 9.2  MG  --  2.0  PHOS  --  3.5   GFR: Estimated Creatinine Clearance: 142.8 mL/min (by C-G formula based on SCr of 0.77 mg/dL). Liver Function Tests: Recent Labs  Lab 08/09/23 1550  AST 22  ALT 21  ALKPHOS 61  BILITOT 0.5  PROT 7.0  ALBUMIN 4.4   No results for input(s): "LIPASE", "AMYLASE" in the last 168 hours. No results for input(s): "AMMONIA" in the last 168 hours. Coagulation Profile: No results for input(s): "INR", "PROTIME" in the last 168 hours. Cardiac Enzymes: No results for input(s): "CKTOTAL", "CKMB", "CKMBINDEX", "TROPONINI" in the last 168 hours. BNP (last 3 results) No results for input(s): "PROBNP" in the last 8760 hours. HbA1C: No results for input(s): "HGBA1C" in the last 72 hours. CBG: Recent Labs  Lab 08/09/23 1539 08/09/23 2151  GLUCAP 104* 94   Lipid Profile: No results for input(s): "CHOL", "HDL", "LDLCALC", "TRIG", "CHOLHDL", "LDLDIRECT" in the last 72 hours. Thyroid Function Tests: No results for input(s): "TSH", "T4TOTAL", "FREET4", "T3FREE", "THYROIDAB" in the last 72 hours. Anemia Panel: No results for input(s): "VITAMINB12", "FOLATE", "FERRITIN", "TIBC", "IRON", "RETICCTPCT" in the last 72 hours. Sepsis Labs: No results for input(s): "PROCALCITON", "LATICACIDVEN" in the last 168 hours.  No results found for this or any previous visit (from the past 240 hours).   Radiology Studies: CT Cervical Spine Wo Contrast Result Date: 08/09/2023 CLINICAL DATA:  Neck trauma, focal neuro deficit or paresthesia (Age 70-64y) EXAM: CT CERVICAL SPINE WITHOUT  CONTRAST TECHNIQUE: Multidetector CT imaging of the cervical spine was performed without intravenous contrast. Multiplanar CT image reconstructions were also generated. RADIATION DOSE REDUCTION: This exam was performed according to the departmental dose-optimization program which includes automated exposure control, adjustment of the mA and/or kV according to patient size and/or use of iterative reconstruction technique. COMPARISON:  None Available. FINDINGS: Alignment: Straightening and mild broad-based reversal of normal lordosis. No traumatic subluxation. Skull base and vertebrae: No acute fracture. Vertebral body heights are maintained. The dens and skull base are intact. None fusion posterior arch of C1 is a normal variant. Soft tissues and spinal canal: No prevertebral fluid or swelling. No visible canal hematoma. Disc levels:  Preserved. Upper chest: Negative. Other: None. IMPRESSION: 1. No acute fracture or subluxation of the cervical spine. 2. Straightening and mild broad-based reversal of normal lordosis may be positional or due to muscle spasm. Electronically Signed   By: Narda Rutherford  M.D.   On: 08/09/2023 17:34   CT Head Wo Contrast Result Date: 08/09/2023 CLINICAL DATA:  Head trauma, abnormal mental status (Age 52-64y) EXAM: CT HEAD WITHOUT CONTRAST TECHNIQUE: Contiguous axial images were obtained from the base of the skull through the vertex without intravenous contrast. RADIATION DOSE REDUCTION: This exam was performed according to the departmental dose-optimization program which includes automated exposure control, adjustment of the mA and/or kV according to patient size and/or use of iterative reconstruction technique. COMPARISON:  None Available. FINDINGS: Brain: No intracranial hemorrhage, mass effect, or midline shift. No hydrocephalus. The basilar cisterns are patent. No evidence of territorial infarct or acute ischemia. No extra-axial or intracranial fluid collection. Vascular: No  hyperdense vessel or unexpected calcification. Skull: Normal. Negative for fracture or focal lesion. Sinuses/Orbits: Scattered mucosal thickening in the ethmoid air cells. No sinus fluid levels. Unremarkable appearance of the orbits. Other: None. IMPRESSION: No acute intracranial abnormality. No skull fracture. Electronically Signed   By: Narda Rutherford M.D.   On: 08/09/2023 17:31    Scheduled Meds:  heparin  5,000 Units Subcutaneous Q8H   Continuous Infusions:  lacosamide (VIMPAT) IV     lactated ringers 75 mL/hr at 08/10/23 0003     LOS: 1 day   Hughie Closs, MD [facility]  08/10/2023, 7:55 AM   *Please note that this is a verbal dictation therefore any spelling or grammatical errors are due to the "Dragon [employer] One" system interpretation.  Please page via Amion and do not message via secure chat for urgent patient care matters. Secure chat can be used for non urgent patient care matters.  How to contact the [facility] Attending or Consulting provider 7A - 7P or covering provider during after hours 7P -7A, for this patient?  Check the care team in [facility] and look for a) attending/consulting [facility] provider listed and b) the [facility] team listed. Page or secure chat 7A-7P. Log into [email or web address] and use [place]'s universal password to access. If you do not have the password, please contact the hospital operator. Locate the [facility] provider you are looking for under [facility] and page to a number that you can be directly reached. If you still have difficulty reaching the provider, please page the [facility] (Director on Call) for the Hospitalists listed on amion for assistance.

## 2023-08-10 NOTE — Progress Notes (Signed)
 Hypoglycemic Event  CBG: 53  Treatment: D50 50 mL (25 gm)  Symptoms: None  Follow-up CBG: Time:14:15 CBG Result:194  Possible Reasons for Event: Inadequate meal intake  Comments/MD notified: Hughie Closs, MD    Ileana Roup

## 2023-08-10 NOTE — Plan of Care (Signed)
  Problem: Clinical Measurements: Goal: Ability to maintain clinical measurements within normal limits will improve Outcome: Progressing Goal: Will remain free from infection Outcome: Progressing Goal: Diagnostic test results will improve Outcome: Progressing Goal: Respiratory complications will improve Outcome: Progressing Goal: Cardiovascular complication will be avoided Outcome: Progressing   Problem: Elimination: Goal: Will not experience complications related to bowel motility Outcome: Progressing Goal: Will not experience complications related to urinary retention Outcome: Progressing   Problem: Safety: Goal: Ability to remain free from injury will improve Outcome: Progressing   Problem: Skin Integrity: Goal: Risk for impaired skin integrity will decrease Outcome: Progressing   

## 2023-08-10 NOTE — Progress Notes (Signed)
 LTM maint complete - no skin breakdown under:  A2, Pz

## 2023-08-10 NOTE — Procedures (Addendum)
 Patient Name: Darius Allen  MRN: [id number]  Epilepsy Attending: Charlsie Quest  Referring Physician/Provider: Erick Blinks, MD  Duration: 08/09/2023 2257 to 08/10/2023 2257  Patient history:  18 y.o. male with hx of epilepsy and follows with [facility] pediatric neurology and recently switched from Trileptal to Aptiom due to multiple breakthrough seizures, who was brought in to the ED at [facility] after having 12 seizures in a day. EEG to evaluate for seizure  Level of alertness: Awake, asleep  AEDs during EEG study: LCM, LEV, Ativan  Technical aspects: This EEG study was done with scalp electrodes positioned according to the 10-20 International system of electrode placement. Electrical activity was reviewed with band pass filter of 1-70Hz , sensitivity of 7 uV/mm, display speed of 8mm/sec with a 60Hz  notched filter applied as appropriate. EEG data were recorded continuously and digitally stored.  Video monitoring was available and reviewed as appropriate.  Description: The posterior dominant rhythm consists of 9-10 Hz activity of moderate voltage (25-35 uV) seen predominantly in posterior head regions, symmetric and reactive to eye opening and eye closing. Sleep was characterized by vertex waves, sleep spindles (12 to 14 Hz), maximal frontocentral region. Generalized polyspikes and paroxysmal fast activity as noted predominantly in sleep. Hyperventilation and photic stimulation were not performed.     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 No seizures were seen throughout the recording.  Darius Allen

## 2023-08-11 ENCOUNTER — Inpatient Hospital Stay (HOSPITAL_COMMUNITY)

## 2023-08-11 DIAGNOSIS — R569 Unspecified convulsions: Secondary | ICD-10-CM | POA: Diagnosis not present

## 2023-08-11 LAB — CBC WITH DIFFERENTIAL/PLATELET
Abs Immature Granulocytes: 0.02 10*3/uL (ref 0.00–0.07)
Basophils Absolute: 0 10*3/uL (ref 0.0–0.1)
Basophils Relative: 0 %
Eosinophils Absolute: 0.1 10*3/uL (ref 0.0–0.5)
Eosinophils Relative: 1 %
HCT: 40.9 % (ref 39.0–52.0)
Hemoglobin: 13.8 g/dL (ref 13.0–17.0)
Immature Granulocytes: 0 %
Lymphocytes Relative: 35 %
Lymphs Abs: 2.6 10*3/uL (ref 0.7–4.0)
MCH: 30.4 pg (ref 26.0–34.0)
MCHC: 33.7 g/dL (ref 30.0–36.0)
MCV: 90.1 fL (ref 80.0–100.0)
Monocytes Absolute: 0.6 10*3/uL (ref 0.1–1.0)
Monocytes Relative: 8 %
Neutro Abs: 4.1 10*3/uL (ref 1.7–7.7)
Neutrophils Relative %: 56 %
Platelets: 214 10*3/uL (ref 150–400)
RBC: 4.54 MIL/uL (ref 4.22–5.81)
RDW: 12.9 % (ref 11.5–15.5)
WBC: 7.5 10*3/uL (ref 4.0–10.5)
nRBC: 0 % (ref 0.0–0.2)

## 2023-08-11 LAB — GLUCOSE, CAPILLARY: Glucose-Capillary: 83 mg/dL (ref 70–99)

## 2023-08-11 MED ORDER — OXCARBAZEPINE 300 MG PO TABS: 300.0000 mg | ORAL_TABLET | Freq: Two times a day (BID) | ORAL | 0 refills | Status: AC

## 2023-08-11 MED ORDER — LACOSAMIDE 100 MG PO TABS: 100.0000 mg | ORAL_TABLET | Freq: Two times a day (BID) | ORAL | 0 refills | Status: AC

## 2023-08-11 NOTE — Plan of Care (Signed)
  Problem: Activity: Goal: Risk for activity intolerance will decrease Outcome: Progressing   Problem: Nutrition: Goal: Adequate nutrition will be maintained Outcome: Progressing   Problem: Safety: Goal: Ability to remain free from injury will improve Outcome: Progressing   Problem: Elimination: Goal: Will not experience complications related to bowel motility Outcome: Progressing Goal: Will not experience complications related to urinary retention Outcome: Progressing   Problem: Skin Integrity: Goal: Risk for impaired skin integrity will decrease Outcome: Progressing   Problem: Clinical Measurements: Goal: Ability to maintain clinical measurements within normal limits will improve Outcome: Progressing Goal: Will remain free from infection Outcome: Progressing Goal: Diagnostic test results will improve Outcome: Progressing Goal: Respiratory complications will improve Outcome: Progressing Goal: Cardiovascular complication will be avoided Outcome: Progressing

## 2023-08-11 NOTE — Discharge Summary (Signed)
 Physician Discharge Summary  Darius Allen HYQ:[id number] DOB: Nov 26, 2005 DOA: 08/09/2023  PCP: Serita Grit, PA-C  Admit date: 08/09/2023 Discharge date: 08/11/2023 30 Day Unplanned Readmission Risk Score    Flowsheet Row ED to Hosp-Admission (Current) from 08/09/2023 in [facility]  30 Day Unplanned Readmission Risk Score (%) 8.74 Filed at 08/11/2023 0801       This score is the patient's risk of an unplanned readmission within 30 days of being discharged (0 -100%). The score is based on dignosis, age, lab data, medications, orders, and past utilization.   Low:  0-14.9   Medium: 15-21.9   High: 22-29.9   Extreme: 30 and above          Admitted From: Home Disposition: Home  Recommendations for Outpatient Follow-up:  Follow up with PCP in 1-2 weeks Please obtain BMP/CBC in one week Follow-up closely with your neurologist ideally within 1 to 2 weeks Please follow up with your PCP on the following pending results: Unresulted Labs (From admission, onward)    None         Home Health: None Equipment/Devices: None  Discharge Condition: Stable CODE STATUS: Full code Diet recommendation: Cardiac  Subjective: Seen and examined.  No complaints.  Wants to go home.  Brief/Interim Summary: Darius Allen  is a 18 y.o. male, with known past medical history of seizures, is following with [facility] pediatric neurology, he had frequent seizure medications adjustments due to side effect of seizure meds or poor response, most recently he has been on Trileptal, but when he still used to have seizures he went on Trileptal taper with Aptiom initiation, per family they report he is usually as well having insomnia and poor night sleep. -History provided by family at bedside, patient presented to ED secondary to multiple seizures overnight, they reported over 12 seizures, one of them he had a fall from the bed and hurt his head with some significant bruising, and ED patient  has been had another 2 seizures where he required Ativan and he was loaded with Keppra, he had urine incontinence during ED seizure event as well, CT head with no acute findings, significant for mild hypokalemia at 133, mild leukocytosis at 11.3, ED physician discussed with neurology on-call who recommended loading with Keppra and patient was admitted under hospitalist service at [facility] for further management of epilepsy/seizure disorder. Over the last 2 days prior to presentation to ED, he has only taken Aptiom. He is on 1600mg  at bedtime.  Patient was loaded with Keppra, started on Vimpat and Trileptal.  Aptiom discontinued.  Patient was hooked up to continuous EEG, no further seizures for last 36 hours or so.  Patient has been reevaluated by neurology and cleared for discharge.  Neurology recommends follow-up closely with primary neurologist at [facility] and deferred to them to uptitrate his Trileptal dose.  Discharge Diagnoses:  Principal Problem:   Seizure [facility])    Discharge Instructions   Allergies as of 08/11/2023   No Known Allergies      Medication List     STOP taking these medications    Aptiom 800 MG Tabs Generic drug: Eslicarbazepine Acetate       TAKE these medications    Lacosamide 100 MG Tabs Take 1 tablet (100 mg total) by mouth every 12 (twelve) hours.   Midazolam 5 MG/0.1ML Soln Place 5 mg into the nose as needed (seizures lasting >5 minutes long OR 3+ seizures in 1 hour without return to baseline).  Oxcarbazepine 300 MG tablet Commonly known as: TRILEPTAL Take 1 tablet (300 mg total) by mouth 2 (two) times daily. What changed:  medication strength how much to take when to take this additional instructions        Follow-up Information     Serita Grit, PA-C Follow up in 1 week(s).   Specialty: Physician Assistant Contact information: [address] [place] [address] [phone]                No Known  Allergies  Consultations: Neurology   Procedures/Studies: Overnight EEG with video Result Date: 08/10/2023 Charlsie Quest, MD     08/11/2023  7:53 AM Patient Name: Darius Allen MRN: [id number] Epilepsy Attending: Charlsie Quest Referring Physician/Provider: Erick Blinks, MD Duration: 08/09/2023 2257 to 08/10/2023 2257 Patient history:  18 y.o. male with hx of epilepsy and follows with [facility] pediatric neurology and recently switched from Trileptal to Aptiom due to multiple breakthrough seizures, who was brought in to the ED at [facility] after having 12 seizures in a day. EEG to evaluate for seizure Level of alertness: Awake, asleep AEDs during EEG study: LCM, LEV, Ativan Technical aspects: This EEG study was done with scalp electrodes positioned according to the 10-20 International system of electrode placement. Electrical activity was reviewed with band pass filter of 1-70Hz , sensitivity of 7 uV/mm, display speed of 64mm/sec with a 60Hz  notched filter applied as appropriate. EEG data were recorded continuously and digitally stored.  Video monitoring was available and reviewed as appropriate. Description: The posterior dominant rhythm consists of 9-10 Hz activity of moderate voltage (25-35 uV) seen predominantly in posterior head regions, symmetric and reactive to eye opening and eye closing. Sleep was characterized by vertex waves, sleep spindles (12 to 14 Hz), maximal frontocentral region. Generalized polyspikes and paroxysmal fast activity as noted predominantly in sleep. Hyperventilation and photic stimulation were not performed.   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 No seizures were seen throughout the recording. Charlsie Quest   CT Cervical Spine Wo Contrast Result Date: 08/09/2023 CLINICAL DATA:  Neck trauma, focal neuro deficit or paresthesia (Age 57-64y)  EXAM: CT CERVICAL SPINE WITHOUT CONTRAST TECHNIQUE: Multidetector CT imaging of the cervical spine was performed without intravenous contrast. Multiplanar CT image reconstructions were also generated. RADIATION DOSE REDUCTION: This exam was performed according to the departmental dose-optimization program which includes automated exposure control, adjustment of the mA and/or kV according to patient size and/or use of iterative reconstruction technique. COMPARISON:  None Available. FINDINGS: Alignment: Straightening and mild broad-based reversal of normal lordosis. No traumatic subluxation. Skull base and vertebrae: No acute fracture. Vertebral body heights are maintained. The dens and skull base are intact. None fusion posterior arch of C1 is a normal variant. Soft tissues and spinal canal: No prevertebral fluid or swelling. No visible canal hematoma. Disc levels:  Preserved. Upper chest: Negative. Other: None. IMPRESSION: 1. No acute fracture or subluxation of the cervical spine. 2. Straightening and mild broad-based reversal of normal lordosis may be positional or due to muscle spasm. Electronically Signed   By: Narda Rutherford M.D.   On: 08/09/2023 17:34   CT Head Wo Contrast Result Date: 08/09/2023 CLINICAL DATA:  Head trauma, abnormal mental status (Age 14-64y) EXAM: CT HEAD WITHOUT CONTRAST TECHNIQUE: Contiguous axial images were obtained from the base of the skull through the vertex without intravenous contrast. RADIATION DOSE REDUCTION: This  exam was performed according to the departmental dose-optimization program which includes automated exposure control, adjustment of the mA and/or kV according to patient size and/or use of iterative reconstruction technique. COMPARISON:  None Available. FINDINGS: Brain: No intracranial hemorrhage, mass effect, or midline shift. No hydrocephalus. The basilar cisterns are patent. No evidence of territorial infarct or acute ischemia. No extra-axial or intracranial fluid  collection. Vascular: No hyperdense vessel or unexpected calcification. Skull: Normal. Negative for fracture or focal lesion. Sinuses/Orbits: Scattered mucosal thickening in the ethmoid air cells. No sinus fluid levels. Unremarkable appearance of the orbits. Other: None. IMPRESSION: No acute intracranial abnormality. No skull fracture. Electronically Signed   By: Narda Rutherford M.D.   On: 08/09/2023 17:31     Discharge Exam: Vitals:   08/11/23 0317 08/11/23 0734  BP: 112/62 115/65  Pulse: (!) 58 (!) 52  Resp: 18 19  Temp: 98.9 F (37.2 C) 99 F (37.2 C)  SpO2: 97% 97%   Vitals:   08/10/23 1948 08/10/23 2350 08/11/23 0317 08/11/23 0734  BP: 123/73 (!) 120/58 112/62 115/65  Pulse: (!) 57 (!) 55 (!) 58 (!) 52  Resp:  18 18 19   Temp: 97.7 F (36.5 C) 98.3 F (36.8 C) 98.9 F (37.2 C) 99 F (37.2 C)  TempSrc: Oral Oral Oral Oral  SpO2: 100% 100% 97% 97%  Weight:      Height:        General: Pt is alert, awake, not in acute distress Cardiovascular: RRR, S1/S2 +, no rubs, no gallops Respiratory: CTA bilaterally, no wheezing, no rhonchi Abdominal: Soft, NT, ND, bowel sounds + Extremities: no edema, no cyanosis    The results of significant diagnostics from this hospitalization (including imaging, microbiology, ancillary and laboratory) are listed below for reference.     Microbiology: No results found for this or any previous visit (from the past 240 hours).   Labs: BNP (last 3 results) No results for input(s): "BNP" in the last 8760 hours. Basic Metabolic Panel: Recent Labs  Lab 08/09/23 1550 08/10/23 0612  NA 133* 137  K 4.0 4.0  CL 101 104  CO2 22 24  GLUCOSE 88 79  BUN 12 7  CREATININE 0.80 0.77  CALCIUM 9.1 9.2  MG  --  2.0  PHOS  --  3.5   Liver Function Tests: Recent Labs  Lab 08/09/23 1550  AST 22  ALT 21  ALKPHOS 61  BILITOT 0.5  PROT 7.0  ALBUMIN 4.4   No results for input(s): "LIPASE", "AMYLASE" in the last 168 hours. No results for  input(s): "AMMONIA" in the last 168 hours. CBC: Recent Labs  Lab 08/09/23 1550 08/10/23 0612 08/11/23 0637  WBC 11.3* 10.5 7.5  NEUTROABS 9.4*  --  4.1  HGB 13.8 12.7* 13.8  HCT 40.4 37.6* 40.9  MCV 90.0 90.0 90.1  PLT 231 200 214   Cardiac Enzymes: Recent Labs  Lab 08/10/23 2017  CKTOTAL 232   BNP: Invalid input(s): "POCBNP" CBG: Recent Labs  Lab 08/10/23 1415 08/10/23 1734 08/10/23 1840 08/10/23 2351 08/11/23 0316  GLUCAP 194* 59* 168* 99 83   D-Dimer No results for input(s): "DDIMER" in the last 72 hours. Hgb A1c No results for input(s): "HGBA1C" in the last 72 hours. Lipid Profile No results for input(s): "CHOL", "HDL", "LDLCALC", "TRIG", "CHOLHDL", "LDLDIRECT" in the last 72 hours. Thyroid function studies No results for input(s): "TSH", "T4TOTAL", "T3FREE", "THYROIDAB" in the last 72 hours.  Invalid input(s): "FREET3" Anemia work up No results for input(s): "  VITAMINB12", "FOLATE", "FERRITIN", "TIBC", "IRON", "RETICCTPCT" in the last 72 hours. Urinalysis No results found for: "COLORURINE", "APPEARANCEUR", "LABSPEC", "PHURINE", "GLUCOSEU", "HGBUR", "BILIRUBINUR", "KETONESUR", "PROTEINUR", "UROBILINOGEN", "NITRITE", "LEUKOCYTESUR" Sepsis Labs Recent Labs  Lab 08/09/23 1550 08/10/23 0612 08/11/23 0637  WBC 11.3* 10.5 7.5   Microbiology No results found for this or any previous visit (from the past 240 hours).  FURTHER DISCHARGE INSTRUCTIONS:   Get Medicines reviewed and adjusted: Please take all your medications with you for your next visit with your Primary MD   Laboratory/radiological data: Please request your Primary MD to go over all hospital tests and procedure/radiological results at the follow up, please ask your Primary MD to get all Hospital records sent to his/her office.   In some cases, they will be blood work, cultures and biopsy results pending at the time of your discharge. Please request that your primary care M.D. goes through all  the records of your hospital data and follows up on these results.   Also Note the following: If you experience worsening of your admission symptoms, develop shortness of breath, life threatening emergency, suicidal or homicidal thoughts you must seek medical attention immediately by calling 911 or calling your MD immediately  if symptoms less severe.   You must read complete instructions/literature along with all the possible adverse reactions/side effects for all the Medicines you take and that have been prescribed to you. Take any new Medicines after you have completely understood and accpet all the possible adverse reactions/side effects.    Do not drive when taking Pain medications or sleeping medications (Benzodaizepines)   Do not take more than prescribed Pain, Sleep and Anxiety Medications. It is not advisable to combine anxiety,sleep and pain medications without talking with your primary care practitioner   Special Instructions: If you have smoked or chewed Tobacco  in the last 2 yrs please stop smoking, stop any regular Alcohol  and or any Recreational drug use.   Wear Seat belts while driving.   Please note: You were cared for by a hospitalist during your hospital stay. Once you are discharged, your primary care physician will handle any further medical issues. Please note that NO REFILLS for any discharge medications will be authorized once you are discharged, as it is imperative that you return to your primary care physician (or establish a relationship with a primary care physician if you do not have one) for your post hospital discharge needs so that they can reassess your need for medications and monitor your lab values  Time coordinating discharge: Over 30 minutes  SIGNED:   Hughie Closs, MD  [facility] 08/11/2023, 10:21 AM *Please note that this is a verbal dictation therefore any spelling or grammatical errors are due to the "[employer] One" system  interpretation. If 7PM-7AM, please contact night-coverage [email or web address]

## 2023-08-11 NOTE — Discharge Instructions (Addendum)
 To control seizures, your medications have been adjusted as follows:  - Started Vimpat (lacosamide) 100 mg twice daily - Restarted Trileptal (oxcarbazapine) 300 mg twice daily - STOPPED Aptiom (Eslicarbazepine Acetate) at your request  Please call your outpatient neurologist for a close follow-up as they may want to adjust your medications further   Please keep track of any other spells you have, including shaking spells, loss of consciousness events, staring spells etc to review with your neurologist   Spell log:  - Date and time of event: - Description of event:  - Prodome (any warning / premonition event is going to happen): - Post-spell symptoms: - Any potential triggers:   Standard seizure precautions: Per [employer] statutes, patients with seizures are not allowed to drive until  they have been seizure-free for six months. Use caution when using heavy equipment or power tools. Avoid working on ladders or at heights. Take showers instead of baths. Ensure the water temperature is not too high on the home water heater. Do not go swimming alone. When caring for infants or small children, sit down when holding, feeding, or changing them to minimize risk of injury to the child in the event you have a seizure.  To reduce risk of seizures, maintain good sleep hygiene avoid alcohol and illicit drug use, take all anti-seizure medications as prescribed.

## 2023-08-11 NOTE — Progress Notes (Signed)
 vLTM discontinued  No skin breakdown noted at all skin sites  [facility] notified

## 2023-08-11 NOTE — Progress Notes (Signed)
 Neurology Progress Note  Brief HPI: 18 year old male with history of epilepsy followed by [facility] pediatric neurology recently switched from Trileptal to Apitom due to multiple breakthrough seizures, transferred to [facility] where he was evaluated for 12 seizures in one day at home.  Patient with reported impaired sleep over the last week.  Recent cross taper of Trileptal with wean over 3 weeks and up titration of Apitom over that time.  Patient with Apitom monotherapy for 2 days PTA (1600 mg at bedtime) with multiple seizures reported on 3/27 and 12 seizures on 3/28.   Subjective: Patient awake, sitting up in bed watching television and on his cell phone.  He is at his baseline mental status this morning, LTM EEG monitoring remains in place.  Patient states that he is ready to go home.  Discussed plan with patient at bedside and patient's father via telephone and they both express readiness for discharge.   Exam: Vitals:   08/11/23 0317 08/11/23 0734  BP: 112/62 115/65  Pulse: (!) 58 (!) 52  Resp: 18 19  Temp: 98.9 F (37.2 C) 99 F (37.2 C)  SpO2: 97% 97%   Gen: Sitting up in bed, in no acute distress Resp: non-labored breathing, no respiratory distress on room air  Abd: soft, nt  Neuro: Mental Status: Awake, alert, and oriented throughout.  He is conversant with good attention and concentration.  Speech is fluent without dysarthria or aphasia.  Follows all commands without difficulty. Participates throughout exam.  Cranial Nerves: PERRL, EOMI, VFF, face is symmetric resting and with movement, hearing is intact to voice, shoulders shrug symmetrically, phonation intact, tongue is midline Motor/Sensory: Moves each extremity spontaneously and antigravity throughout without noted weakness or asymmetry. 5/5 bilateral upper extremities distally and proximally to confrontation.  Gait: Deferred.   Pertinent Labs: CBC    Component Value Date/Time   WBC 7.5 08/11/2023 0637   RBC 4.54 08/11/2023 0637    HGB 13.8 08/11/2023 0637   HCT 40.9 08/11/2023 0637   PLT 214 08/11/2023 0637   MCV 90.1 08/11/2023 0637   MCH 30.4 08/11/2023 0637   MCHC 33.7 08/11/2023 0637   RDW 12.9 08/11/2023 0637   RDW 13.9 06/30/2014 0000   LYMPHSABS 2.6 08/11/2023 0637   LYMPHSABS 4.0 (H) 06/30/2014 0000   MONOABS 0.6 08/11/2023 0637   EOSABS 0.1 08/11/2023 0637   EOSABS 0.1 06/30/2014 0000   BASOSABS 0.0 08/11/2023 0637   BASOSABS 0.0 06/30/2014 0000   CMP     Component Value Date/Time   NA 137 08/10/2023 0612   K 4.0 08/10/2023 0612   CL 104 08/10/2023 0612   CO2 24 08/10/2023 0612   GLUCOSE 79 08/10/2023 0612   BUN 7 08/10/2023 0612   CREATININE 0.77 08/10/2023 0612   CALCIUM 9.2 08/10/2023 0612   PROT 7.0 08/09/2023 1550   ALBUMIN 4.4 08/09/2023 1550   AST 22 08/09/2023 1550   ALT 21 08/09/2023 1550   ALKPHOS 61 08/09/2023 1550   BILITOT 0.5 08/09/2023 1550   GFRNONAA >60 08/10/2023 0612   Drugs of Abuse     Component Value Date/Time   LABOPIA NONE DETECTED 08/10/2023 1135   COCAINSCRNUR NONE DETECTED 08/10/2023 1135   LABBENZ POSITIVE (A) 08/10/2023 1135   AMPHETMU NONE DETECTED 08/10/2023 1135   THCU POSITIVE (A) 08/10/2023 1135   LABBARB NONE DETECTED 08/10/2023 1135    Imaging Reviewed:  CT Head without contrast(Personally reviewed): CTH was negative for a large hypodensity concerning for a large territory infarct or hyperdensity  concerning for an ICH   Neurodiagnostics cEEG:  "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 No seizures were seen throughout the recording."  Assessment: Darius Allen is a 18 y.o. male with hx of epilepsy and follows with [facility] pediatric neurology and recently switched from Trileptal to Aptiom due to multiple breakthrough seizures, who was brought in to the ED at [facility] after having 12 seizures in a day.   Has been sleep deprived recently. Family worried about Aptiom not being effective. No fever, no recent  URI like symptoms.   He was initially too somnolent to tolerate PO and too agitated to attempt NG tube with improvement in mental status 3/29 into 3/30.   Family requesting switching back to Trileptal.  Exam improved to patient baseline this morning 3/30 and patient expresses readiness for discharge.   Recommendations: - Discontinue LTM EEG monitoring - Continue Vimpat 100 mg BID  - Restart trileptal -- 300mg  BID for now - Further medication titration per outpatient neurologist, consider increasing Trileptal to prior dose versus increasing Vimpat if needed - Stopped Aptiom at family's request. - Seizure precautions as below at discharge  Seizure precautions: Per [facility] statutes, patients with seizures are not allowed to drive until they have been seizure-free for six months and cleared by a physician    Use caution when using heavy equipment or power tools. Avoid working on ladders or at heights. Take showers instead of baths. Ensure the water temperature is not too high on the home water heater. Do not go swimming alone. Do not lock yourself in a room alone (i.e. bathroom). When caring for infants or small children, sit down when holding, feeding, or changing them to minimize risk of injury to the child in the event you have a seizure. Maintain good sleep hygiene. Avoid alcohol.    If patient has another seizure, call 911 and bring them back to the ED if: A.  The seizure lasts longer than 5 minutes.      B.  The patient doesn't wake shortly after the seizure or has new problems such as difficulty seeing, speaking or moving following the seizure C.  The patient was injured during the seizure D.  The patient has a temperature over 102 F (39C) E.  The patient vomited during the seizure and now is having trouble breathing    During the Seizure   - First, ensure adequate ventilation and place patients on the floor on their left side  Loosen clothing around the neck and ensure  the airway is patent. If the patient is clenching the teeth, do not force the mouth open with any object as this can cause severe damage - Remove all items from the surrounding that can be hazardous. The patient may be oblivious to what's happening and may not even know what he or she is doing. If the patient is confused and wandering, either gently guide him/her away and block access to outside areas - Reassure the individual and be comforting - Call 911. In most cases, the seizure ends before EMS arrives. However, there are cases when seizures may last over 3 to 5 minutes. Or the individual may have developed breathing difficulties or severe injuries. If a pregnant patient or a person with diabetes develops a seizure, it is prudent to call an ambulance. - Finally, if the patient does not regain full consciousness, then call EMS. Most patients will remain confused for about 45 to 90 minutes after  a seizure, so you must use judgment in calling for help. - Avoid restraints but make sure the patient is in a bed with padded side rails - Place the individual in a lateral position with the neck slightly flexed; this will help the saliva drain from the mouth and prevent the tongue from falling backward - Remove all nearby furniture and other hazards from the area - Provide verbal assurance as the individual is regaining consciousness - Provide the patient with privacy if possible - Call for help and start treatment as ordered by the caregiver    After the Seizure (Postictal Stage)   After a seizure, most patients experience confusion, fatigue, muscle pain and/or a headache. Thus, one should permit the individual to sleep. For the next few days, reassurance is essential. Being calm and helping reorient the person is also of importance.   Most seizures are painless and end spontaneously. Seizures are not harmful to others but can lead to complications such as stress on the lungs, brain and the heart.  Individuals with prior lung problems may develop labored breathing and respiratory distress.   Lanae Boast, AGACNP-BC [facility] [phone]  Plan discussed with me, patient discharged prior to me being able to see her in person.  Discussed with primary team via secure chat

## 2023-08-11 NOTE — Procedures (Addendum)
 Patient Name: Darius Allen  MRN: [id number]  Epilepsy Attending: Charlsie Quest  Referring Physician/Provider: Erick Blinks, MD  Duration: 08/10/2023 2257 to 08/11/2023 0912   Patient history:  18 y.o. male with hx of epilepsy and follows with [facility] pediatric neurology and recently switched from Trileptal to Aptiom due to multiple breakthrough seizures, who was brought in to the ED at [facility] after having 12 seizures in a day. EEG to evaluate for seizure   Level of alertness: Awake, asleep   AEDs during EEG study: LCM, LEV   Technical aspects: This EEG study was done with scalp electrodes positioned according to the 10-20 International system of electrode placement. Electrical activity was reviewed with band pass filter of 1-70Hz , sensitivity of 7 uV/mm, display speed of 42mm/sec with a 60Hz  notched filter applied as appropriate. EEG data were recorded continuously and digitally stored.  Video monitoring was available and reviewed as appropriate.   Description: The posterior dominant rhythm consists of 9-10 Hz activity of moderate voltage (25-35 uV) seen predominantly in posterior head regions, symmetric and reactive to eye opening and eye closing. Sleep was characterized by vertex waves, sleep spindles (12 to 14 Hz), maximal frontocentral region. Generalized polyspikes and paroxysmal fast activity as noted predominantly in sleep. Hyperventilation and photic stimulation were not performed.      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 No seizures were seen throughout the recording.   [name] Annabelle Harman

## 2023-08-11 NOTE — ED Provider Notes (Signed)
 [place] [facility] Provider Note   CSN: [id number] Arrival date & time: 08/09/23  1533     History  Chief Complaint  Patient presents with   Seizures    Darius Allen is a 18 y.o. male.  Patient has a history of seizures.  Patient has had his medicine changed recently and he is tapering off his other medicine.  He has had numerous seizures in the last 24 hours and 2 grand mal seizures.  The history is provided by the patient and a relative. No language interpreter was used.  Seizures Seizure activity on arrival: no   Seizure type:  Grand mal Preceding symptoms: no sensation of an aura present   Initial focality:  None Episode characteristics: abnormal movements   Postictal symptoms: confusion   Return to baseline: no   Severity:  Severe Timing:  Clustered Progression:  Unchanged Context: not alcohol withdrawal        Home Medications Prior to Admission medications   Medication Sig Start Date End Date Taking? Authorizing Provider  Midazolam 5 MG/0.1ML SOLN Place 5 mg into the nose as needed (seizures lasting >5 minutes long OR 3+ seizures in 1 hour without return to baseline). 06/06/23  Yes [provider]  lacosamide 100 MG TABS Take 1 tablet (100 mg total) by mouth every 12 (twelve) hours. 08/11/23   Hughie Closs, MD  Oxcarbazepine (TRILEPTAL) 300 MG tablet Take 1 tablet (300 mg total) by mouth 2 (two) times daily. 08/11/23 09/10/23  Hughie Closs, MD      Allergies    Patient has no known allergies.    Review of Systems   Review of Systems  Unable to perform ROS: Mental status change  Neurological:  Positive for seizures.    Physical Exam Updated Vital Signs BP 128/82 (BP Location: Left Arm)   Pulse 84   Temp (!) 97.3 F (36.3 C) (Oral)   Resp 18   Ht 6' (1.829 m)   Wt 68 kg   SpO2 100%   BMI 20.33 kg/m  Physical Exam Vitals and nursing note reviewed.  Constitutional:      Appearance: He is well-developed.     Comments:  Lethargic  HENT:     Head: Normocephalic.     Nose: Nose normal.  Eyes:     General: No scleral icterus.    Conjunctiva/sclera: Conjunctivae normal.  Neck:     Thyroid: No thyromegaly.  Cardiovascular:     Rate and Rhythm: Normal rate and regular rhythm.     Heart sounds: No murmur heard.    No friction rub. No gallop.  Pulmonary:     Breath sounds: No stridor. No wheezing or rales.  Chest:     Chest wall: No tenderness.  Abdominal:     General: There is no distension.     Tenderness: There is no abdominal tenderness. There is no rebound.  Musculoskeletal:        General: Normal range of motion.     Cervical back: Neck supple.  Lymphadenopathy:     Cervical: No cervical adenopathy.  Skin:    Findings: No erythema or rash.  Neurological:     Motor: No abnormal muscle tone.     Coordination: Coordination normal.     Comments: Oriented to person and place only  Psychiatric:        Behavior: Behavior normal.     ED Results / Procedures / Treatments   Labs (all labs ordered are listed, but  only abnormal results are displayed) Labs Reviewed  CBC WITH DIFFERENTIAL/PLATELET - Abnormal; Notable for the following components:      Result Value   WBC 11.3 (*)    Neutro Abs 9.4 (*)    All other components within normal limits  COMPREHENSIVE METABOLIC PANEL WITH GFR - Abnormal; Notable for the following components:   Sodium 133 (*)    All other components within normal limits  CBC - Abnormal; Notable for the following components:   RBC 4.18 (*)    Hemoglobin 12.7 (*)    HCT 37.6 (*)    All other components within normal limits  RAPID URINE DRUG SCREEN, HOSP PERFORMED - Abnormal; Notable for the following components:   Benzodiazepines POSITIVE (*)    Tetrahydrocannabinol POSITIVE (*)    All other components within normal limits  GLUCOSE, CAPILLARY - Abnormal; Notable for the following components:   Glucose-Capillary 54 (*)    All other components within normal limits   GLUCOSE, CAPILLARY - Abnormal; Notable for the following components:   Glucose-Capillary 53 (*)    All other components within normal limits  GLUCOSE, CAPILLARY - Abnormal; Notable for the following components:   Glucose-Capillary 194 (*)    All other components within normal limits  GLUCOSE, CAPILLARY - Abnormal; Notable for the following components:   Glucose-Capillary 59 (*)    All other components within normal limits  GLUCOSE, CAPILLARY - Abnormal; Notable for the following components:   Glucose-Capillary 168 (*)    All other components within normal limits  CBG MONITORING, ED - Abnormal; Notable for the following components:   Glucose-Capillary 104 (*)    All other components within normal limits  BASIC METABOLIC PANEL WITH GFR  MAGNESIUM  PHOSPHORUS  GLUCOSE, CAPILLARY  CK  CBC WITH DIFFERENTIAL/PLATELET  GLUCOSE, CAPILLARY  GLUCOSE, CAPILLARY    EKG EKG Interpretation Date/Time:  Friday August 09 2023 15:52:17 EDT Ventricular Rate:  88 PR Interval:  170 QRS Duration:  92 QT Interval:  346 QTC Calculation: 419 R Axis:   77  Text Interpretation: Sinus rhythm ST elev, probable normal early repol pattern n(t Confirmed by Gwyneth Sprout ([id number]) on 08/10/2023 10:10:29 PM  Radiology Overnight EEG with video Result Date: 08/10/2023 Darius Quest, MD     08/11/2023  7:53 AM Patient Name: Darius Allen MRN: [id number] Epilepsy Attending: Charlsie Allen Referring Physician/Provider: Erick Blinks, MD Duration: 08/09/2023 2257 to 08/10/2023 2257 Patient history:  18 y.o. male with hx of epilepsy and follows with [facility] pediatric neurology and recently switched from Trileptal to Aptiom due to multiple breakthrough seizures, who was brought in to the ED at [facility] after having 12 seizures in a day. EEG to evaluate for seizure Level of alertness: Awake, asleep AEDs during EEG study: LCM, LEV, Ativan Technical aspects: This EEG study was done with scalp electrodes positioned  according to the 10-20 International system of electrode placement. Electrical activity was reviewed with band pass filter of 1-70Hz , sensitivity of 7 uV/mm, display speed of 70mm/sec with a 60Hz  notched filter applied as appropriate. EEG data were recorded continuously and digitally stored.  Video monitoring was available and reviewed as appropriate. Description: The posterior dominant rhythm consists of 9-10 Hz activity of moderate voltage (25-35 uV) seen predominantly in posterior head regions, symmetric and reactive to eye opening and eye closing. Sleep was characterized by vertex waves, sleep spindles (12 to 14 Hz), maximal frontocentral region. Generalized polyspikes and paroxysmal fast activity as noted predominantly in sleep. Hyperventilation  and photic stimulation were not performed.   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 No seizures were seen throughout the recording. Darius Allen   CT Cervical Spine Wo Contrast Result Date: 08/09/2023 CLINICAL DATA:  Neck trauma, focal neuro deficit or paresthesia (Age 40-64y) EXAM: CT CERVICAL SPINE WITHOUT CONTRAST TECHNIQUE: Multidetector CT imaging of the cervical spine was performed without intravenous contrast. Multiplanar CT image reconstructions were also generated. RADIATION DOSE REDUCTION: This exam was performed according to the departmental dose-optimization program which includes automated exposure control, adjustment of the mA and/or kV according to patient size and/or use of iterative reconstruction technique. COMPARISON:  None Available. FINDINGS: Alignment: Straightening and mild broad-based reversal of normal lordosis. No traumatic subluxation. Skull base and vertebrae: No acute fracture. Vertebral body heights are maintained. The dens and skull base are intact. None fusion posterior arch of C1 is a normal variant.  Soft tissues and spinal canal: No prevertebral fluid or swelling. No visible canal hematoma. Disc levels:  Preserved. Upper chest: Negative. Other: None. IMPRESSION: 1. No acute fracture or subluxation of the cervical spine. 2. Straightening and mild broad-based reversal of normal lordosis may be positional or due to muscle spasm. Electronically Signed   By: Narda Rutherford M.D.   On: 08/09/2023 17:34   CT Head Wo Contrast Result Date: 08/09/2023 CLINICAL DATA:  Head trauma, abnormal mental status (Age 5-64y) EXAM: CT HEAD WITHOUT CONTRAST TECHNIQUE: Contiguous axial images were obtained from the base of the skull through the vertex without intravenous contrast. RADIATION DOSE REDUCTION: This exam was performed according to the departmental dose-optimization program which includes automated exposure control, adjustment of the mA and/or kV according to patient size and/or use of iterative reconstruction technique. COMPARISON:  None Available. FINDINGS: Brain: No intracranial hemorrhage, mass effect, or midline shift. No hydrocephalus. The basilar cisterns are patent. No evidence of territorial infarct or acute ischemia. No extra-axial or intracranial fluid collection. Vascular: No hyperdense vessel or unexpected calcification. Skull: Normal. Negative for fracture or focal lesion. Sinuses/Orbits: Scattered mucosal thickening in the ethmoid air cells. No sinus fluid levels. Unremarkable appearance of the orbits. Other: None. IMPRESSION: No acute intracranial abnormality. No skull fracture. Electronically Signed   By: Narda Rutherford M.D.   On: 08/09/2023 17:31    Procedures Procedures    Medications Ordered in ED Medications  heparin injection 5,000 Units (5,000 Units Subcutaneous Given 08/11/23 0619)  LORazepam (ATIVAN) injection 1 mg (has no administration in time range)  lactated ringers infusion (0 mLs Intravenous Stopped 08/10/23 1857)  Oxcarbazepine (TRILEPTAL) tablet 300 mg (300 mg Oral Given  08/11/23 0854)  lacosamide (VIMPAT) tablet 100 mg (100 mg Oral Given 08/11/23 0854)    Or  lacosamide (VIMPAT) 100 mg in sodium chloride 0.9 % 25 mL IVPB ( Intravenous See Alternative 08/11/23 0854)  LORazepam (ATIVAN) 2 MG/ML injection (2 mg  Given 08/09/23 1815)  levETIRAcetam (KEPPRA) IVPB 1500 mg/ 100 mL premix (0 mg Intravenous Stopped 08/09/23 1838)    Followed by  levETIRAcetam (KEPPRA) IVPB 1500 mg/ 100 mL premix (0 mg Intravenous Stopped 08/09/23 1840)    Followed by  levETIRAcetam (KEPPRA) IVPB 1000 mg/100 mL premix (0 mg Intravenous Stopped 08/09/23 1839)  lacosamide (VIMPAT) 200 mg in sodium chloride 0.9 % 25 mL IVPB (0 mg Intravenous Stopped 08/10/23 0003)  LORazepam (ATIVAN) injection 1 mg (1 mg Intravenous Given 08/09/23 2236)  dextrose 50 % solution 50 mL (50  mLs Intravenous Given 08/10/23 1349)  dextrose 50 % solution 50 mL ( Intravenous Duplicate 08/10/23 1548)  dextrose 50 % solution 50 mL (50 mLs Intravenous Given 08/10/23 1818)    ED Course/ Medical Decision Making/ A&P  CRITICAL CARE Performed by: Bethann Berkshire Total critical care time: 55 minutes Critical care time was exclusive of separately billable procedures and treating other patients. Critical care was necessary to treat or prevent imminent or life-threatening deterioration. Critical care was time spent personally by me on the following activities: development of treatment plan with patient and/or surrogate as well as nursing, discussions with consultants, evaluation of patient's response to treatment, examination of patient, obtaining history from patient or surrogate, ordering and performing treatments and interventions, ordering and review of laboratory studies, ordering and review of radiographic studies, pulse oximetry and re-evaluation of patient's condition.   Patient with multiple seizures in the emergency department.  He was loaded with Keppra and given Ativan.  Neurology with consulted and the patient will be  admitted to medicine with neurology consult at [facility]                               Medical Decision Making Amount and/or Complexity of Data Reviewed Labs: ordered. Radiology: ordered.  Risk Prescription drug management. Decision regarding hospitalization.    This patient presents to the ED for concern of seizures, this involves an extensive number of treatment options, and is a complaint that carries with it a high risk of complications and morbidity.  The differential diagnosis includes uncontrolled seizures   Co morbidities that complicate the patient evaluation  Seizures   Additional history obtained:  Additional history obtained from father and brother External records from outside source obtained and reviewed including hospital records   Lab Tests:  I Ordered, and personally interpreted labs.  The pertinent results include: Chemistries unremarkable   Imaging Studies ordered:  I ordered imaging studies including CT head and cervical spine I independently visualized and interpreted imaging which showed markable I agree with the radiologist interpretation   Cardiac Monitoring: / EKG:  The patient was maintained on a cardiac monitor.  I personally viewed and interpreted the cardiac monitored which showed an underlying rhythm of: Normal sinus rhythm   Consultations Obtained:  I requested consultation with the neurology and hospitalist,  and discussed lab and imaging findings as well as pertinent plan - they recommend: Admit to hospitalist with neurology consult visit can   Problem List / ED Course / Critical interventions / Medication management  Seizures I ordered medication including Keppra and Ativan Reevaluation of the patient after these medicines showed that the patient improved I have reviewed the patients home medicines and have made adjustments as needed   Social Determinants of Health:  None   Test / Admission -  Considered:  None      Status epilepticus        Final Clinical Impression(s) / ED Diagnoses Final diagnoses:  Seizure (HCC)    Rx / DC Orders ED Discharge Orders          Ordered    Oxcarbazepine (TRILEPTAL) 300 MG tablet  2 times daily        08/11/23 1020    lacosamide 100 MG TABS  Every 12 hours        08/11/23 1020              Bethann Berkshire, MD 08/11/23 1204

## 2023-08-25 DIAGNOSIS — G40909 Epilepsy, unspecified, not intractable, without status epilepticus: Secondary | ICD-10-CM | POA: Diagnosis not present

## 2023-08-25 DIAGNOSIS — R569 Unspecified convulsions: Secondary | ICD-10-CM | POA: Diagnosis not present

## 2023-08-26 DIAGNOSIS — G40909 Epilepsy, unspecified, not intractable, without status epilepticus: Secondary | ICD-10-CM | POA: Diagnosis not present

## 2023-08-27 DIAGNOSIS — G40909 Epilepsy, unspecified, not intractable, without status epilepticus: Secondary | ICD-10-CM | POA: Diagnosis not present

## 2023-08-27 DIAGNOSIS — G40109 Localization-related (focal) (partial) symptomatic epilepsy and epileptic syndromes with simple partial seizures, not intractable, without status epilepticus: Secondary | ICD-10-CM | POA: Diagnosis not present

## 2023-08-27 DIAGNOSIS — R569 Unspecified convulsions: Secondary | ICD-10-CM | POA: Diagnosis not present

## 2023-09-05 DIAGNOSIS — G40109 Localization-related (focal) (partial) symptomatic epilepsy and epileptic syndromes with simple partial seizures, not intractable, without status epilepticus: Secondary | ICD-10-CM | POA: Diagnosis not present

## 2023-10-16 DIAGNOSIS — R569 Unspecified convulsions: Secondary | ICD-10-CM | POA: Diagnosis not present

## 2023-10-16 DIAGNOSIS — Z9911 Dependence on respirator [ventilator] status: Secondary | ICD-10-CM | POA: Diagnosis not present

## 2023-10-16 DIAGNOSIS — R0902 Hypoxemia: Secondary | ICD-10-CM | POA: Diagnosis not present

## 2023-10-16 DIAGNOSIS — R4182 Altered mental status, unspecified: Secondary | ICD-10-CM | POA: Diagnosis not present

## 2023-10-16 DIAGNOSIS — R579 Shock, unspecified: Secondary | ICD-10-CM | POA: Diagnosis not present

## 2023-10-16 DIAGNOSIS — Z4682 Encounter for fitting and adjustment of non-vascular catheter: Secondary | ICD-10-CM | POA: Diagnosis not present

## 2023-10-16 DIAGNOSIS — G40909 Epilepsy, unspecified, not intractable, without status epilepticus: Secondary | ICD-10-CM | POA: Diagnosis not present

## 2023-10-16 DIAGNOSIS — I161 Hypertensive emergency: Secondary | ICD-10-CM | POA: Diagnosis not present

## 2023-10-16 DIAGNOSIS — Z452 Encounter for adjustment and management of vascular access device: Secondary | ICD-10-CM | POA: Diagnosis not present

## 2023-10-16 DIAGNOSIS — G40109 Localization-related (focal) (partial) symptomatic epilepsy and epileptic syndromes with simple partial seizures, not intractable, without status epilepticus: Secondary | ICD-10-CM | POA: Diagnosis not present

## 2023-10-17 DIAGNOSIS — G40909 Epilepsy, unspecified, not intractable, without status epilepticus: Secondary | ICD-10-CM | POA: Diagnosis not present

## 2023-10-17 DIAGNOSIS — Z9911 Dependence on respirator [ventilator] status: Secondary | ICD-10-CM | POA: Diagnosis not present

## 2023-10-17 DIAGNOSIS — R579 Shock, unspecified: Secondary | ICD-10-CM | POA: Diagnosis not present

## 2023-10-17 DIAGNOSIS — G40109 Localization-related (focal) (partial) symptomatic epilepsy and epileptic syndromes with simple partial seizures, not intractable, without status epilepticus: Secondary | ICD-10-CM | POA: Diagnosis not present

## 2023-10-18 DIAGNOSIS — R569 Unspecified convulsions: Secondary | ICD-10-CM | POA: Diagnosis not present

## 2023-12-13 DIAGNOSIS — G40109 Localization-related (focal) (partial) symptomatic epilepsy and epileptic syndromes with simple partial seizures, not intractable, without status epilepticus: Secondary | ICD-10-CM | POA: Diagnosis not present

## 2023-12-16 NOTE — Progress Notes (Signed)
 Adult Epilepsy Ambulatory Social Work II  SW sent pt therapy resources via mail as pt has yet to read [employer].  Natasha A. Malvina, LCSW Ambulatory Social Worker II Adult Epilepsy Clinic [phone]

## 2024-02-06 ENCOUNTER — Encounter (HOSPITAL_COMMUNITY): Payer: Self-pay

## 2024-02-06 ENCOUNTER — Other Ambulatory Visit: Payer: Self-pay

## 2024-02-06 ENCOUNTER — Emergency Department (HOSPITAL_COMMUNITY)
Admission: EM | Admit: 2024-02-06 | Discharge: 2024-02-07 | Disposition: A | Discharge status: Home / Self Care | Attending: Emergency Medicine | Admitting: Emergency Medicine

## 2024-02-06 DIAGNOSIS — T426X1A Poisoning by other antiepileptic and sedative-hypnotic drugs, accidental (unintentional), initial encounter: Secondary | ICD-10-CM | POA: Diagnosis present

## 2024-02-06 DIAGNOSIS — R400 Somnolence: Secondary | ICD-10-CM | POA: Insufficient documentation

## 2024-02-06 DIAGNOSIS — T50901A Poisoning by unspecified drugs, medicaments and biological substances, accidental (unintentional), initial encounter: Secondary | ICD-10-CM | POA: Diagnosis not present

## 2024-02-06 NOTE — ED Triage Notes (Signed)
 Pov from home with family. Cc of accidentally taking his seizure medications twice. Took first doses around 10-10:15 then took it again around 45 min PTA.  C/o lightheadedness and drowsiness.  Took trileptal  and vimpat  twice

## 2024-02-06 NOTE — ED Provider Notes (Signed)
 [place] [facility] AT [facility]  Provider Note  CSN: [id number] Arrival date & time: 02/06/24 2313  History Chief Complaint  Patient presents with   Drug Overdose    Accidental     Darius Allen is a 18 y.o. 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 He is feeling drowsy but otherwise at baseline. He denies intentional overdose or SI.    Home Medications Prior to Admission medications   Medication Sig Start Date End Date Taking? Authorizing Provider  VALTOCO 15 MG DOSE 2 x 7.5 MG/0.1ML LQPK Use as needed for seizures lasting more than 5 minutes or for a cluster of seizures. Use one device to give one spray (7.5 mg) in one nostril, and then give one spray (7.5mg ) from second device in other nostril, for a total dose of 15mg . 10/18/23  Yes [provider]  clonazePAM  (KLONOPIN ) 0.5 MG tablet Take 0.5 mg by mouth daily as needed.    [provider]  lacosamide  100 MG TABS Take 1 tablet (100 mg total) by mouth every 12 (twelve) hours. 08/11/23   Vernon Ranks, MD  Midazolam  5 MG/0.1ML SOLN Place 5 mg into the nose as needed (seizures lasting >5 minutes long OR 3+ seizures in 1 hour without return to baseline). 06/06/23   [provider]  Oxcarbazepine  (TRILEPTAL ) 300 MG tablet Take 1 tablet (300 mg total) by mouth 2 (two) times daily. 08/11/23 09/10/23  Vernon Ranks, MD  oxcarbazepine  (TRILEPTAL ) 600 MG tablet Take by mouth.    [provider]     Allergies    Patient has no known allergies.   Review of Systems   Review of Systems Please see HPI for pertinent positives and negatives  Physical Exam BP 124/87   Pulse 67   Temp 98 F (36.7 C) (Oral)   Resp 18   Ht 6' (1.829 m)   Wt 68 kg   SpO2 96%   BMI 20.33 kg/m   Physical Exam Vitals and nursing note reviewed.   Constitutional:      Appearance: Normal appearance.  HENT:     Head: Normocephalic and atraumatic.     Nose: Nose normal.     Mouth/Throat:     Mouth: Mucous membranes are moist.  Eyes:     Extraocular Movements: Extraocular movements intact.     Conjunctiva/sclera: Conjunctivae normal.  Cardiovascular:     Rate and Rhythm: Normal rate.  Pulmonary:     Effort: Pulmonary effort is normal.     Breath sounds: Normal breath sounds.  Abdominal:     General: Abdomen is flat.     Palpations: Abdomen is soft.     Tenderness: There is no abdominal tenderness.  Musculoskeletal:        General: No swelling. Normal range of motion.     Cervical back: Neck supple.  Skin:    General: Skin is warm and dry.  Neurological:     General: No focal deficit present.     Mental Status: He is alert.  Psychiatric:        Mood and Affect: Mood normal.     ED Results / Procedures / Treatments   EKG None  Procedures Procedures  Medications Ordered in the ED Medications - No data to display  Initial Impression and Plan  Patient here with accidental overdose of seizure  medications this evening, feeling drowsy but otherwise asymptomatic with reassuring vitals and exam. I spoke with Poison Control who did not recommend any labs, cardiac monitoring or ED observation. They recommend he wait 12 hours before his next dose in the morning and to [facility] for any other acute concerns in the meantime.   ED Course       MDM Rules/Calculators/A&P Medical Decision Making Problems Addressed: Accidental overdose, initial encounter: acute illness or injury     Final Clinical Impression(s) / ED Diagnoses Final diagnoses:  Accidental overdose, initial encounter    Rx / DC Orders ED Discharge Orders     None        Roselyn Carlin NOVAK, MD 02/06/24 2357

## 2024-04-06 ENCOUNTER — Emergency Department (HOSPITAL_COMMUNITY)
Admission: EM | Admit: 2024-04-06 | Discharge: 2024-04-06 | Disposition: A | Discharge status: Home / Self Care | Attending: Emergency Medicine | Admitting: Emergency Medicine

## 2024-04-06 ENCOUNTER — Other Ambulatory Visit: Payer: Self-pay

## 2024-04-06 ENCOUNTER — Encounter (HOSPITAL_COMMUNITY): Payer: Self-pay

## 2024-04-06 DIAGNOSIS — H6122 Impacted cerumen, left ear: Secondary | ICD-10-CM | POA: Insufficient documentation

## 2024-04-06 DIAGNOSIS — H9202 Otalgia, left ear: Secondary | ICD-10-CM | POA: Diagnosis present

## 2024-04-06 MED ORDER — DOCUSATE SODIUM 50 MG/5ML PO LIQD
50.0000 mg | Freq: Once | ORAL | Status: AC
  Administered 2024-04-06: 50 mg via OTIC
  Filled 2024-04-06: qty 10

## 2024-04-06 NOTE — ED Provider Notes (Signed)
 [place] EMERGENCY DEPARTMENT AT [facility] Provider Note   CSN: [id number] Arrival date & time: 04/06/24  1851     Patient presents with: Otalgia (Left ear)   Darius Allen is a 18 y.o. male.    Otalgia Associated symptoms: congestion   Patient presents for left ear pain for the past 2 days.  He has had some recent congestion.  Yesterday morning, he woke up and was picking at his ear.  He has since had pain and pressure in the left ear.  He denies any other recent symptoms.     Prior to Admission medications   Medication Sig Start Date End Date Taking? Authorizing Provider  clonazePAM  (KLONOPIN ) 0.5 MG tablet Take 0.5 mg by mouth daily as needed.    [provider]  lacosamide  100 MG TABS Take 1 tablet (100 mg total) by mouth every 12 (twelve) hours. 08/11/23   Vernon Ranks, MD  Midazolam  5 MG/0.1ML SOLN Place 5 mg into the nose as needed (seizures lasting >5 minutes long OR 3+ seizures in 1 hour without return to baseline). 06/06/23   [provider]  Oxcarbazepine  (TRILEPTAL ) 300 MG tablet Take 1 tablet (300 mg total) by mouth 2 (two) times daily. 08/11/23 09/10/23  Vernon Ranks, MD  oxcarbazepine  (TRILEPTAL ) 600 MG tablet Take by mouth.    [provider]  VALTOCO 15 MG DOSE 2 x 7.5 MG/0.1ML LQPK Use as needed for seizures lasting more than 5 minutes or for a cluster of seizures. Use one device to give one spray (7.5 mg) in one nostril, and then give one spray (7.5mg ) from second device in other nostril, for a total dose of 15mg . 10/18/23   [provider]    Allergies: Patient has no known allergies.    Review of Systems  HENT:  Positive for congestion and ear pain.   All other systems reviewed and are negative.   Updated Vital Signs BP 124/74 (BP Location: Right Arm)   Pulse 72   Temp 98 F (36.7 C) (Oral)   Resp 18   Ht 6' (1.829 m)   Wt 68 kg   SpO2 99%   BMI 20.33 kg/m   Physical Exam Vitals and nursing note  reviewed.  Constitutional:      General: He is not in acute distress.    Appearance: Normal appearance. He is well-developed. He is not ill-appearing, toxic-appearing or diaphoretic.  HENT:     Head: Normocephalic and atraumatic.     Right Ear: Ear canal and external ear normal. There is impacted cerumen.     Left Ear: Tympanic membrane, ear canal and external ear normal.     Nose: Nose normal.     Mouth/Throat:     Mouth: Mucous membranes are moist.  Eyes:     Extraocular Movements: Extraocular movements intact.     Conjunctiva/sclera: Conjunctivae normal.  Cardiovascular:     Rate and Rhythm: Normal rate and regular rhythm.  Pulmonary:     Effort: Pulmonary effort is normal. No respiratory distress.  Abdominal:     General: There is no distension.  Musculoskeletal:        General: No swelling. Normal range of motion.     Cervical back: Normal range of motion and neck supple.  Skin:    General: Skin is warm and dry.     Coloration: Skin is not jaundiced or pale.  Neurological:     General: No focal deficit present.  Mental Status: He is alert and oriented to person, place, and time.  Psychiatric:        Mood and Affect: Mood normal.        Behavior: Behavior normal.     (all labs ordered are listed, but only abnormal results are displayed) Labs Reviewed - No data to display  EKG: None  Radiology: No results found.   Ear Cerumen Removal  Date/Time: 04/06/2024 8:24 PM  Performed by: Melvenia Motto, MD Authorized by: Melvenia Motto, MD   Consent:    Consent obtained:  Verbal   Consent given by:  Patient   Risks, benefits, and alternatives were discussed: yes     Risks discussed:  Incomplete removal and dizziness   Alternatives discussed:  No treatment and delayed treatment Universal protocol:    Procedure explained and questions answered to patient or proxy's satisfaction: yes     Patient identity confirmed:  Verbally with patient Procedure details:     Location:  L ear   Procedure type: irrigation     Procedure outcomes: cerumen removed   Post-procedure details:    Inspection:  TM intact, ear canal clear and no bleeding   Hearing quality:  Improved   Procedure completion:  Tolerated well, no immediate complications    Medications Ordered in the ED  docusate (COLACE) 50 MG/5ML liquid 50 mg (50 mg Left EAR Given 04/06/24 1950)                                    Medical Decision Making Risk OTC drugs.   Patient presenting for 2 days of left ear fullness and pain.  On arrival in the ED, vital signs are normal.  He is well-appearing on exam.  He has had some mild recent congestion but denies any other associated symptoms.  On otoscope inspection, patient has normal EAC, however, TM is obstructed by impacted cerumen.  Will attempt wax removal.  Underwent 2 rounds of Colace and irrigation.  A large amount of cerumen was removed.  Underlying TM is normal in appearance.  Patient was discharged in good condition.     Final diagnoses:  Impacted cerumen of left ear    ED Discharge Orders     None          Melvenia Motto, MD 04/06/24 2025

## 2024-04-06 NOTE — Discharge Instructions (Signed)
 Your symptoms were likely caused by impacted earwax.  This has been removed.  Return for any new or worsening symptoms of concern.

## 2024-04-06 NOTE — ED Triage Notes (Signed)
 Pt arrived via POV from home c/o left ear pain X 2 days. Pt reports recent sinus congestion, but has resolved. Pt does endorse difficulty hearing from his left ear.
